# Patient Record
Sex: Female | Born: 1975 | Hispanic: No | Marital: Married | Smoking: Never smoker
Health system: Southern US, Community
[De-identification: ages and names within clinical notes are randomized; demographics above are authoritative.]

## PROBLEM LIST (undated history)

## (undated) ENCOUNTER — Emergency Department (HOSPITAL_COMMUNITY): Payer: 59

## (undated) DIAGNOSIS — J309 Allergic rhinitis, unspecified: Secondary | ICD-10-CM

## (undated) DIAGNOSIS — H519 Unspecified disorder of binocular movement: Secondary | ICD-10-CM

## (undated) DIAGNOSIS — E559 Vitamin D deficiency, unspecified: Secondary | ICD-10-CM

## (undated) DIAGNOSIS — E781 Pure hyperglyceridemia: Secondary | ICD-10-CM

## (undated) DIAGNOSIS — D509 Iron deficiency anemia, unspecified: Secondary | ICD-10-CM

## (undated) DIAGNOSIS — Z8742 Personal history of other diseases of the female genital tract: Secondary | ICD-10-CM

## (undated) DIAGNOSIS — R7302 Impaired glucose tolerance (oral): Secondary | ICD-10-CM

## (undated) DIAGNOSIS — L259 Unspecified contact dermatitis, unspecified cause: Secondary | ICD-10-CM

## (undated) DIAGNOSIS — E7800 Pure hypercholesterolemia, unspecified: Secondary | ICD-10-CM

## (undated) DIAGNOSIS — I10 Essential (primary) hypertension: Secondary | ICD-10-CM

## (undated) HISTORY — DX: Unspecified contact dermatitis, unspecified cause: L25.9

## (undated) HISTORY — DX: Essential (primary) hypertension: I10

## (undated) HISTORY — DX: Vitamin D deficiency, unspecified: E55.9

## (undated) HISTORY — DX: Impaired glucose tolerance (oral): R73.02

## (undated) HISTORY — DX: Unspecified disorder of binocular movement: H51.9

## (undated) HISTORY — DX: Personal history of other diseases of the female genital tract: Z87.42

## (undated) HISTORY — PX: OTHER SURGICAL HISTORY: SHX169

## (undated) HISTORY — DX: Allergic rhinitis, unspecified: J30.9

## (undated) HISTORY — DX: Iron deficiency anemia, unspecified: D50.9

## (undated) HISTORY — PX: TONSILLECTOMY: SUR1361

## (undated) HISTORY — DX: Pure hyperglyceridemia: E78.1

## (undated) HISTORY — DX: Pure hypercholesterolemia, unspecified: E78.00

---

## 2003-01-15 ENCOUNTER — Other Ambulatory Visit: Admission: RE | Admit: 2003-01-15 | Discharge: 2003-01-15 | Payer: Self-pay | Admitting: *Deleted

## 2004-11-05 ENCOUNTER — Inpatient Hospital Stay (HOSPITAL_COMMUNITY): Admission: AD | Admit: 2004-11-05 | Discharge: 2004-11-05 | Payer: Self-pay | Admitting: *Deleted

## 2004-11-07 ENCOUNTER — Inpatient Hospital Stay (HOSPITAL_COMMUNITY): Admission: AD | Admit: 2004-11-07 | Discharge: 2004-11-10 | Payer: Self-pay | Admitting: Obstetrics & Gynecology

## 2004-12-26 ENCOUNTER — Ambulatory Visit: Payer: Self-pay | Admitting: Endocrinology

## 2004-12-30 ENCOUNTER — Ambulatory Visit: Payer: Self-pay | Admitting: Endocrinology

## 2005-01-03 ENCOUNTER — Ambulatory Visit: Payer: Self-pay | Admitting: Endocrinology

## 2005-09-29 ENCOUNTER — Ambulatory Visit: Payer: Self-pay | Admitting: Endocrinology

## 2006-08-08 ENCOUNTER — Ambulatory Visit: Payer: Self-pay | Admitting: Endocrinology

## 2007-01-01 ENCOUNTER — Ambulatory Visit: Payer: Self-pay | Admitting: Endocrinology

## 2007-01-01 LAB — CONVERTED CEMR LAB
BUN: 12 mg/dL (ref 6–23)
CO2: 34 meq/L — ABNORMAL HIGH (ref 19–32)
Glucose, Bld: 113 mg/dL — ABNORMAL HIGH (ref 70–99)
Potassium: 3.5 meq/L (ref 3.5–5.1)
Sed Rate: 10 mm/hr (ref 0–25)
Sodium: 141 meq/L (ref 135–145)

## 2007-02-11 ENCOUNTER — Encounter: Payer: Self-pay | Admitting: Endocrinology

## 2007-02-11 DIAGNOSIS — I10 Essential (primary) hypertension: Secondary | ICD-10-CM

## 2007-02-11 HISTORY — DX: Essential (primary) hypertension: I10

## 2007-04-12 ENCOUNTER — Ambulatory Visit (HOSPITAL_COMMUNITY): Admission: RE | Admit: 2007-04-12 | Discharge: 2007-04-12 | Payer: Self-pay | Admitting: Obstetrics and Gynecology

## 2007-04-12 ENCOUNTER — Encounter (INDEPENDENT_AMBULATORY_CARE_PROVIDER_SITE_OTHER): Payer: Self-pay | Admitting: Obstetrics and Gynecology

## 2008-04-10 ENCOUNTER — Ambulatory Visit: Payer: Self-pay | Admitting: Endocrinology

## 2008-07-26 ENCOUNTER — Ambulatory Visit (HOSPITAL_COMMUNITY): Admission: RE | Admit: 2008-07-26 | Discharge: 2008-07-26 | Payer: Self-pay | Admitting: Obstetrics and Gynecology

## 2008-08-13 ENCOUNTER — Telehealth (INDEPENDENT_AMBULATORY_CARE_PROVIDER_SITE_OTHER): Payer: Self-pay | Admitting: *Deleted

## 2008-08-18 ENCOUNTER — Ambulatory Visit: Payer: Self-pay | Admitting: Endocrinology

## 2008-08-19 LAB — CONVERTED CEMR LAB
Albumin: 3.7 g/dL (ref 3.5–5.2)
Alkaline Phosphatase: 83 units/L (ref 39–117)
BUN: 8 mg/dL (ref 6–23)
Basophils Absolute: 0 10*3/uL (ref 0.0–0.1)
Basophils Relative: 0.4 % (ref 0.0–3.0)
Bilirubin Urine: NEGATIVE
Bilirubin, Direct: 0.1 mg/dL (ref 0.0–0.3)
Calcium: 8.8 mg/dL (ref 8.4–10.5)
Eosinophils Absolute: 0.3 10*3/uL (ref 0.0–0.7)
GFR calc non Af Amer: 122 mL/min
Glucose, Bld: 102 mg/dL — ABNORMAL HIGH (ref 70–99)
HDL: 35.9 mg/dL — ABNORMAL LOW (ref >39.0)
Monocytes Absolute: 0.5 10*3/uL (ref 0.1–1.0)
Neutro Abs: 6.5 10*3/uL (ref 1.4–7.7)
Neutrophils Relative %: 70 % (ref 43.0–77.0)
Nitrite: NEGATIVE
RBC: 4.56 M/uL (ref 3.87–5.11)
RDW: 11.9 % (ref 11.5–14.6)
Sodium: 139 meq/L (ref 135–145)
Specific Gravity, Urine: 1.01 (ref 1.000–1.035)
TSH: 1.34 microintl units/mL (ref 0.35–5.50)
Total Bilirubin: 0.9 mg/dL (ref 0.3–1.2)
Total Protein, Urine: NEGATIVE mg/dL
Urobilinogen, UA: 0.2 (ref 0.0–1.0)
pH: 7.5 (ref 5.0–8.0)

## 2008-08-20 ENCOUNTER — Ambulatory Visit: Payer: Self-pay | Admitting: Endocrinology

## 2008-08-20 ENCOUNTER — Telehealth (INDEPENDENT_AMBULATORY_CARE_PROVIDER_SITE_OTHER): Payer: Self-pay | Admitting: *Deleted

## 2008-08-20 DIAGNOSIS — J309 Allergic rhinitis, unspecified: Secondary | ICD-10-CM | POA: Insufficient documentation

## 2008-08-20 DIAGNOSIS — E7800 Pure hypercholesterolemia, unspecified: Secondary | ICD-10-CM

## 2008-08-20 DIAGNOSIS — L259 Unspecified contact dermatitis, unspecified cause: Secondary | ICD-10-CM

## 2008-08-20 DIAGNOSIS — R5383 Other fatigue: Secondary | ICD-10-CM

## 2008-08-20 DIAGNOSIS — R5381 Other malaise: Secondary | ICD-10-CM

## 2008-08-20 HISTORY — DX: Unspecified contact dermatitis, unspecified cause: L25.9

## 2008-08-20 HISTORY — DX: Allergic rhinitis, unspecified: J30.9

## 2008-08-20 HISTORY — DX: Pure hypercholesterolemia, unspecified: E78.00

## 2008-08-20 LAB — CONVERTED CEMR LAB
Folate: >20 ng/mL
Vitamin B-12: 544 pg/mL (ref 211–911)

## 2008-08-21 ENCOUNTER — Telehealth (INDEPENDENT_AMBULATORY_CARE_PROVIDER_SITE_OTHER): Payer: Self-pay | Admitting: *Deleted

## 2008-08-21 ENCOUNTER — Ambulatory Visit: Payer: Self-pay | Admitting: Endocrinology

## 2008-08-22 ENCOUNTER — Encounter: Payer: Self-pay | Admitting: Endocrinology

## 2008-09-02 ENCOUNTER — Telehealth (INDEPENDENT_AMBULATORY_CARE_PROVIDER_SITE_OTHER): Payer: Self-pay | Admitting: *Deleted

## 2008-10-02 ENCOUNTER — Telehealth: Payer: Self-pay | Admitting: Endocrinology

## 2009-02-18 ENCOUNTER — Ambulatory Visit (HOSPITAL_COMMUNITY): Admission: RE | Admit: 2009-02-18 | Discharge: 2009-02-18 | Payer: Self-pay | Admitting: Obstetrics & Gynecology

## 2009-02-26 ENCOUNTER — Ambulatory Visit (HOSPITAL_COMMUNITY): Admission: RE | Admit: 2009-02-26 | Discharge: 2009-02-26 | Payer: Self-pay | Admitting: Obstetrics and Gynecology

## 2009-03-05 ENCOUNTER — Ambulatory Visit (HOSPITAL_COMMUNITY): Admission: RE | Admit: 2009-03-05 | Discharge: 2009-03-05 | Payer: Self-pay | Admitting: Obstetrics and Gynecology

## 2009-03-10 ENCOUNTER — Inpatient Hospital Stay (HOSPITAL_COMMUNITY): Admission: AD | Admit: 2009-03-10 | Discharge: 2009-03-19 | Payer: Self-pay | Admitting: Obstetrics & Gynecology

## 2009-03-15 ENCOUNTER — Encounter: Payer: Self-pay | Admitting: Obstetrics & Gynecology

## 2009-03-18 ENCOUNTER — Other Ambulatory Visit: Payer: Self-pay | Admitting: Obstetrics and Gynecology

## 2009-03-19 ENCOUNTER — Encounter: Payer: Self-pay | Admitting: Obstetrics and Gynecology

## 2009-03-21 ENCOUNTER — Observation Stay (HOSPITAL_COMMUNITY): Admission: AD | Admit: 2009-03-21 | Discharge: 2009-03-22 | Payer: Self-pay | Admitting: Obstetrics and Gynecology

## 2009-03-29 ENCOUNTER — Inpatient Hospital Stay (HOSPITAL_COMMUNITY): Admission: AD | Admit: 2009-03-29 | Discharge: 2009-04-02 | Payer: Self-pay | Admitting: Obstetrics and Gynecology

## 2009-03-30 ENCOUNTER — Encounter (INDEPENDENT_AMBULATORY_CARE_PROVIDER_SITE_OTHER): Payer: Self-pay | Admitting: Obstetrics and Gynecology

## 2009-04-11 ENCOUNTER — Inpatient Hospital Stay (HOSPITAL_COMMUNITY): Admission: AD | Admit: 2009-04-11 | Discharge: 2009-04-11 | Payer: Self-pay | Admitting: Obstetrics and Gynecology

## 2009-09-27 ENCOUNTER — Telehealth (INDEPENDENT_AMBULATORY_CARE_PROVIDER_SITE_OTHER): Payer: Self-pay | Admitting: *Deleted

## 2009-09-28 ENCOUNTER — Telehealth: Payer: Self-pay | Admitting: Internal Medicine

## 2009-10-11 ENCOUNTER — Telehealth: Payer: Self-pay | Admitting: Endocrinology

## 2009-11-01 ENCOUNTER — Ambulatory Visit: Payer: Self-pay | Admitting: Internal Medicine

## 2009-11-01 DIAGNOSIS — L509 Urticaria, unspecified: Secondary | ICD-10-CM | POA: Insufficient documentation

## 2009-11-01 DIAGNOSIS — D509 Iron deficiency anemia, unspecified: Secondary | ICD-10-CM

## 2009-11-01 DIAGNOSIS — Z8742 Personal history of other diseases of the female genital tract: Secondary | ICD-10-CM | POA: Insufficient documentation

## 2009-11-01 DIAGNOSIS — E559 Vitamin D deficiency, unspecified: Secondary | ICD-10-CM

## 2009-11-01 HISTORY — DX: Personal history of other diseases of the female genital tract: Z87.42

## 2009-11-01 HISTORY — DX: Iron deficiency anemia, unspecified: D50.9

## 2009-11-01 HISTORY — DX: Vitamin D deficiency, unspecified: E55.9

## 2009-11-01 LAB — CONVERTED CEMR LAB
BUN: 10 mg/dL (ref 6–23)
CO2: 27 meq/L (ref 19–32)
Eosinophils Absolute: 0.3 10*3/uL (ref 0.0–0.7)
Eosinophils Relative: 2.4 % (ref 0.0–5.0)
Glucose, Bld: 80 mg/dL (ref 70–99)
Hemoglobin: 13.4 g/dL (ref 12.0–15.0)
Ketones, ur: NEGATIVE mg/dL
Lymphocytes Relative: 27.2 % (ref 12.0–46.0)
Lymphs Abs: 2.9 10*3/uL (ref 0.7–4.0)
MCHC: 33.8 g/dL (ref 30.0–36.0)
Monocytes Absolute: 0.5 10*3/uL (ref 0.1–1.0)
Platelets: 209 10*3/uL (ref 150.0–400.0)
Potassium: 4 meq/L (ref 3.5–5.1)
RBC: 4.53 M/uL (ref 3.87–5.11)
Sodium: 139 meq/L (ref 135–145)
Total CHOL/HDL Ratio: 4
Triglycerides: 146 mg/dL (ref 0.0–149.0)
WBC: 10.6 10*3/uL — ABNORMAL HIGH (ref 4.5–10.5)
pH: 6 (ref 5.0–8.0)

## 2009-11-02 LAB — CONVERTED CEMR LAB: Vit D, 25-Hydroxy: 31 ng/mL (ref 30–89)

## 2009-12-03 ENCOUNTER — Ambulatory Visit: Payer: Self-pay | Admitting: Internal Medicine

## 2010-01-20 ENCOUNTER — Telehealth: Payer: Self-pay | Admitting: Internal Medicine

## 2010-02-28 ENCOUNTER — Telehealth: Payer: Self-pay | Admitting: Internal Medicine

## 2010-03-02 ENCOUNTER — Telehealth: Payer: Self-pay | Admitting: Internal Medicine

## 2010-03-15 ENCOUNTER — Encounter: Payer: Self-pay | Admitting: Internal Medicine

## 2010-04-05 ENCOUNTER — Telehealth: Payer: Self-pay | Admitting: Internal Medicine

## 2010-06-30 ENCOUNTER — Other Ambulatory Visit: Payer: Self-pay | Admitting: Internal Medicine

## 2010-06-30 ENCOUNTER — Encounter: Payer: Self-pay | Admitting: Internal Medicine

## 2010-06-30 ENCOUNTER — Ambulatory Visit
Admission: RE | Admit: 2010-06-30 | Discharge: 2010-06-30 | Payer: Self-pay | Source: Home / Self Care | Attending: Internal Medicine | Admitting: Internal Medicine

## 2010-06-30 DIAGNOSIS — H519 Unspecified disorder of binocular movement: Secondary | ICD-10-CM

## 2010-06-30 HISTORY — DX: Unspecified disorder of binocular movement: H51.9

## 2010-06-30 LAB — TSH: TSH: 1 u[IU]/mL (ref 0.35–5.50)

## 2010-06-30 LAB — CBC WITH DIFFERENTIAL/PLATELET
Basophils Absolute: 0 10*3/uL (ref 0.0–0.1)
Basophils Relative: 0.3 % (ref 0.0–3.0)
Eosinophils Absolute: 0.2 10*3/uL (ref 0.0–0.7)
Eosinophils Relative: 1.7 % (ref 0.0–5.0)
HCT: 40.2 % (ref 36.0–46.0)
Hemoglobin: 13.7 g/dL (ref 12.0–15.0)
Lymphocytes Relative: 25.1 % (ref 12.0–46.0)
Lymphs Abs: 2.6 10*3/uL (ref 0.7–4.0)
MCHC: 34 g/dL (ref 30.0–36.0)
MCV: 88.9 fl (ref 78.0–100.0)
Monocytes Absolute: 0.6 10*3/uL (ref 0.1–1.0)
Monocytes Relative: 5.3 % (ref 3.0–12.0)
Neutro Abs: 7 10*3/uL (ref 1.4–7.7)
Neutrophils Relative %: 67.6 % (ref 43.0–77.0)
Platelets: 222 10*3/uL (ref 150.0–400.0)
RBC: 4.52 Mil/uL (ref 3.87–5.11)
RDW: 13.1 % (ref 11.5–14.6)
WBC: 10.4 10*3/uL (ref 4.5–10.5)

## 2010-06-30 LAB — URINALYSIS
Bilirubin Urine: NEGATIVE
Hemoglobin, Urine: NEGATIVE
Ketones, ur: NEGATIVE
Leukocytes, UA: NEGATIVE
Nitrite: NEGATIVE
Specific Gravity, Urine: 1.01 (ref 1.000–1.030)
Total Protein, Urine: NEGATIVE
Urine Glucose: NEGATIVE
Urobilinogen, UA: 0.2 (ref 0.0–1.0)
pH: 7 (ref 5.0–8.0)

## 2010-06-30 LAB — LIPID PANEL
Cholesterol: 180 mg/dL (ref 0–200)
HDL: 42.8 mg/dL (ref >39.00)
LDL Cholesterol: 101 mg/dL — ABNORMAL HIGH (ref 0–99)
Total CHOL/HDL Ratio: 4
Triglycerides: 180 mg/dL — ABNORMAL HIGH (ref 0.0–149.0)
VLDL: 36 mg/dL (ref 0.0–40.0)

## 2010-06-30 LAB — HEPATIC FUNCTION PANEL
ALT: 17 U/L (ref 0–35)
AST: 17 U/L (ref 0–37)
Albumin: 4 g/dL (ref 3.5–5.2)
Alkaline Phosphatase: 76 U/L (ref 39–117)
Bilirubin, Direct: 0.1 mg/dL (ref 0.0–0.3)
Total Bilirubin: 0.5 mg/dL (ref 0.3–1.2)
Total Protein: 6.9 g/dL (ref 6.0–8.3)

## 2010-06-30 LAB — BASIC METABOLIC PANEL
BUN: 11 mg/dL (ref 6–23)
CO2: 28 mEq/L (ref 19–32)
Calcium: 8.7 mg/dL (ref 8.4–10.5)
Chloride: 101 mEq/L (ref 96–112)
Creatinine, Ser: 0.6 mg/dL (ref 0.4–1.2)
GFR: 127.55 mL/min (ref >60.00)
Glucose, Bld: 82 mg/dL (ref 70–99)
Potassium: 3.7 mEq/L (ref 3.5–5.1)
Sodium: 137 mEq/L (ref 135–145)

## 2010-07-09 ENCOUNTER — Encounter: Payer: Self-pay | Admitting: Obstetrics and Gynecology

## 2010-07-10 ENCOUNTER — Encounter: Payer: Self-pay | Admitting: Obstetrics and Gynecology

## 2010-07-11 ENCOUNTER — Encounter: Payer: Self-pay | Admitting: Obstetrics and Gynecology

## 2010-07-19 NOTE — Progress Notes (Signed)
  Phone Note Refill Request Message from:  Patient on March 02, 2010 10:32 AM  Refills Requested: Medication #1:  VERAPAMIL HCL CR 240 MG CR-TABS 1po once daily   Dosage confirmed as above?Dosage Confirmed   Notes: [facility] [address] Initial call taken by: Scharlene Gloss CMA Duncan Dull),  March 02, 2010 10:33 AM Caller: Patient    Prescriptions: VERAPAMIL HCL CR 240 MG CR-TABS (VERAPAMIL HCL) 1po once daily  #90 x 3   Entered by:   Scharlene Gloss CMA (AAMA)   Authorized by:   [name] MD   Signed by:   Scharlene Gloss CMA (AAMA) on 03/02/2010   Method used:   Faxed to ...       [facility] (retail)       [address]       [place], [place]  [address]       Ph: [phone] or [phone]       Fax: [phone]   RxID:   [id number]

## 2010-07-19 NOTE — Miscellaneous (Signed)
Summary: Immunization Entry   Immunization History:  Influenza Immunization History:    Influenza:  historical (03/12/2010)  [facility] [address], [place] [address] Influenza Site, Left Deltoid IM Exp. Date, 11/2010 Lot # [id number] [employer]

## 2010-07-19 NOTE — Progress Notes (Signed)
  Phone Note Refill Request Message from:  Fax from Pharmacy on October 11, 2009 11:55 AM  Refills Requested: Medication #1:  ALLEGRA 180 MG TABS qd   Dosage confirmed as above?Dosage Confirmed Initial call taken by: Josph Macho RMA,  October 11, 2009 11:55 AM    Prescriptions: ALLEGRA 180 MG TABS (FEXOFENADINE HCL) qd  #30 Tablet x 1   Entered by:   Josph Macho RMA   Authorized by:   Minus Breeding MD   Signed by:   Josph Macho RMA on 10/11/2009   Method used:   Electronically to        [facility] (retail)       [address]       [place], [place]  [address]       Ph: [phone]       Fax: [phone]   RxID:   [id number]

## 2010-07-19 NOTE — Progress Notes (Signed)
Summary: ok to switch from [name] to [name]  ---- Converted from flag ---- ---- 09/17/2009 12:49 PM, [name] MD wrote: ok with me, only if ok with ellison  ---- 09/17/2009 11:52 AM, [name] wrote:   ---- 09/17/2009 11:52 AM, [name] wrote: Doctors'  Pt wants to switch pcp's from [name] to [facility].Pls let me know. Ashley Wyatt,Ashley Wyatt -  Jun 22, 1975    [name] [phone] ------------------------------

## 2010-07-19 NOTE — Progress Notes (Signed)
Summary: medication increase  Phone Note From Pharmacy   Caller: [facility] [address] Summary of Call: Pharmacy sent fax, patient said MD spoke to her about increasing the strength of her Verapamil to 240mg . Patient is requesting to have new prescription sent in for this increase. Initial call taken by: Robin Ewing CMA Duncan Dull),  February 28, 2010 9:00 AM  Follow-up for Phone Call        we mentioned this might be a good idea if her BP still elevated -   has she been checking her BP and what would have they been like? Follow-up by: [name] MD,  February 28, 2010 9:02 AM  Additional Follow-up for Phone Call Additional follow up Details #1::        called pt left. msg to call back  called pt left msg. to call back Additional Follow-up by: Robin Ewing CMA Duncan Dull),  February 28, 2010 9:39 AM    Additional Follow-up for Phone Call Additional follow up Details #2::    called pt. and she has been checking BP at work. BP has been on an average of 145/90, also has been having HA's almost daily. Patient also requested a prescription strength Vitamin D as she can't remember to take the lower dose once daily. Follow-up by: Zella Ball Ewing CMA Duncan Dull),  February 28, 2010 12:05 PM  Additional Follow-up for Phone Call Additional follow up Details #3:: Details for Additional Follow-up Action Taken: ok to incr to 240 mg - done per emr  I would not do the rx strength 50K vit d - has not been shown to help reduce risk of fracture Additional Follow-up by: [name] MD,  February 28, 2010 1:01 PM  New/Updated Medications: VERAPAMIL HCL CR 240 MG CR-TABS (VERAPAMIL HCL) 1po once daily Prescriptions: VERAPAMIL HCL CR 240 MG CR-TABS (VERAPAMIL HCL) 1po once daily  #90 x 3   Entered and Authorized by:   [name] MD   Signed by:   [name] MD on 02/28/2010   Method used:   Electronically to        [facility]  [phone][facility] (retail)       [address] [place], [place]  [address]       Ph: [phone] or [phone]       Fax: [phone]   RxID:   [id number]   Appended Document: medication increase called pt informed prescription sent in and informed of above information.

## 2010-07-19 NOTE — Progress Notes (Signed)
Summary: elevated BP  Phone Note Call from Patient Call back at [facility] Phone [phone]   Caller: Patient Summary of Call: Pt called stating that her BP is still elevated on 240mg  Verapamil. Pt checks BP at lest once daily and it averages 140-150/80-95. Pt is requesting an alternate medicine to help control her BP. Initial call taken by: Margaret Pyle, CMA,  April 05, 2010 8:05 AM  Follow-up for Phone Call        ok to add the hctz 12.5 once daily;   due for OV 4 wks Follow-up by: [name] MD,  April 05, 2010 1:07 PM  Additional Follow-up for Phone Call Additional follow up Details #1::        Pharmacy changed per pt req. Pt advised to make ROV and will call back to sch. Pt is hesitant to try HCTZ but will discuss with MD at OV. Additional Follow-up by: Margaret Pyle, CMA,  April 05, 2010 1:23 PM    New/Updated Medications: HYDROCHLOROTHIAZIDE 12.5 MG CAPS (HYDROCHLOROTHIAZIDE) 1 by mouth once daily Prescriptions: HYDROCHLOROTHIAZIDE 12.5 MG CAPS (HYDROCHLOROTHIAZIDE) 1 by mouth once daily  #90 x 3   Entered by:   Margaret Pyle, CMA   Authorized by:   [name] MD   Signed by:   Margaret Pyle, CMA on 04/05/2010   Method used:   Electronically to        [facility] [address] [address] (retail)       [address]       [place], [place]  [address]       Ph: [phone] or [phone]       Fax: [phone]   RxID:   [id number] HYDROCHLOROTHIAZIDE 12.5 MG CAPS (HYDROCHLOROTHIAZIDE) 1 by mouth once daily  #90 x 3   Entered and Authorized by:   [name] MD   Signed by:   [name] MD on 04/05/2010   Method used:   Electronically to        [facility]  [phone][facility] (retail)       [address] [place], [place]  [address]       Ph: [phone] or [phone]       Fax: [phone]   RxID:   [id number]

## 2010-07-19 NOTE — Progress Notes (Signed)
Summary: Vit D  Phone Note Call from Patient   Caller: Patient [phone] Summary of Call: pt called requesting Vit D level be check with her CBX labs tomorrow 04.15 Pt is currently on prescription strength Vit D Rx's by GYN Initial call taken by: Margaret Pyle, CMA,  September 28, 2009 2:35 PM  Follow-up for Phone Call        ok to add vit D level to cpx labs - v70.0 Follow-up by: [name] MD,  September 28, 2009 2:37 PM  Additional Follow-up for Phone Call Additional follow up Details #1::        added Vitamin D to CPX labs Additional Follow-up by: Scharlene Gloss,  September 28, 2009 2:44 PM

## 2010-07-19 NOTE — Progress Notes (Signed)
----   Converted from flag ---- ---- 03/02/2010 10:14 AM, [name] wrote: Zella Ball,  This pt called and needs the prescription that was sent yesterday sent to [facility] - the one sent yesterday was sent to wrong pharmacy per pt.Thank you.  [name] ------------------------------ Ok will resend prescription to correct pharmacy.

## 2010-07-19 NOTE — Progress Notes (Signed)
Summary: Hep series  ---- Converted from flag ---- ---- 01/19/2010 4:38 PM, [name] MD wrote: yes, that would be fine, should still work out ok  ---- 01/19/2010 4:26 PM, Margaret Pyle, CMA wrote: Pt called stating that she forgot to return for Hep series after 1st shot in May. Does pt need to re-start or can she come in for the second now? ------------------------------  Phone Note Outgoing Call Call back at [facility] Phone [phone]   Call placed by: Margaret Pyle, CMA,  January 20, 2010 10:30 AM Call placed to: Patient Summary of Call: Pt informed that she can make appt on injection schedule to continue Hep series. Pt stated that she was going out of town but would call to sch when she returns. Initial call taken by: Margaret Pyle, CMA,  January 20, 2010 10:31 AM

## 2010-07-19 NOTE — Assessment & Plan Note (Signed)
Summary: NEW PT CPX/ TRANSFER FROM DR [name]/NWS  #   Vital Signs:  Patient profile:   35 year old female Height:      65 inches Weight:      205.25 pounds BMI:     34.28 O2 Sat:      97 % on Room air Temp:     98.7 degrees F oral BP sitting:   132 / 82  (left arm) Cuff size:   regular  Vitals Entered ByZella Ball Ewing (Nov 01, 2009 1:23 PM)  O2 Flow:  Room air  CC: Adult Physical/RE   CC:  Adult Physical/RE.  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 food allergy  but methyldopa can cause hives;  Pt denies CP, sob, doe, wheezing, orthopnea, pnd, worsening LE edema, palps, dizziness or syncope   Pt denies new neuro symptoms such as headache, facial or extremity weakness   Preventive Screening-Counseling & Management  Alcohol-Tobacco     Smoking Status: never      Drug Use:  no.    Problems Prior to Update: 1)  Urticaria  (ICD-708.9) 2)  Vitamin D Deficiency  (ICD-268.9) 3)  Diabetes Mellitus, Gestational, Hx of  (ICD-V13.29) 4)  Anemia-iron Deficiency  (ICD-280.9) 5)  Allergic Rhinitis  (ICD-477.9) 6)  Fatigue  (ICD-780.79) 7)  Eczema  (ICD-692.9) 8)  Hypercholesterolemia  (ICD-272.0) 9)  Routine General Medical Exam@health  Care Facl  (ICD-V70.0) 10)  Hypertension  (ICD-401.9)  Medications Prior to Update: 1)  Fish Oil 300 Mg  Caps (Omega-3 Fatty Acids) .... Take 2 By Mouth Qd 2)  Methyldopa 250 Mg Tabs (Methyldopa) .Marland Kitchen.. 1 By Mouth Two Times A Day 3)  Prenatal Vitamins 0.8 Mg Tabs (Prenatal Multivit-Min-Fe-Fa) .Marland Kitchen.. 1 By Mouth Once Daily 4)  Lotrisone 1-0.05 % Crea (Clotrimazole-Betamethasone) .... Three Times A Day As Needed Rash 5)  Allegra 180 Mg Tabs (Fexofenadine Hcl) .... Qd 6)  Singulair 10 Mg Tabs (Montelukast Sodium) .... Qd 7)  Relamine  Tabs  (Glucos-Chondroit-Paba-E-Lipoic) .Marland Kitchen.. 1 Qd 8)  Macrodantin 50 Mg Caps (Nitrofurantoin Macrocrystal) .... Tid 9)  Metronidazole 250 Mg Tabs (Metronidazole) .... Tid  Current Medications (verified): 1)  Fish Oil 300 Mg  Caps (Omega-3 Fatty Acids) .... Take 2 By Mouth Qd 2)  Verapamil Hcl Cr 180 Mg Xr24h-Cap (Verapamil Hcl) .Marland Kitchen.. 1po Once Daily 3)  Prenatal Vitamins 0.8 Mg Tabs (Prenatal Multivit-Min-Fe-Fa) .Marland Kitchen.. 1 By Mouth Once Daily 4)  Allegra 180 Mg Tabs (Fexofenadine Hcl) .... Qd 5)  Singulair 10 Mg Tabs (Montelukast Sodium) .... Qd 6)  Vitamin D (Ergocalciferol) 50000 Unit Caps (Ergocalciferol) .Marland Kitchen.. 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         all otherwise negative per pt -  except for recurrent mild hives in the last few weeks without obvious etiology and seems easily controlled with otc antihistamine  Physical Exam  General:  alert and overweight-appearing.   Head:  normocephalic and atraumatic.   Eyes:  vision grossly intact, pupils equal, and pupils round.   Ears:  R ear normal and L ear normal.   Nose:  no external deformity and no nasal discharge.   Mouth:  no gingival abnormalities and pharynx pink and moist.   Neck:  supple and no masses.   Lungs:  normal respiratory effort and normal breath sounds.   Heart:  normal rate and regular rhythm.   Abdomen:  soft, non-tender, and normal bowel sounds.   Msk:  no joint tenderness and no joint swelling.   Extremities:  no edema, no erythema  Neurologic:  cranial nerves II-XII intact and strength normal in all extremities.   Skin:  no current hives Psych:  not anxious appearing and not depressed appearing.     Impression & Recommendations:  Problem # 1:  Preventive Health Care (ICD-V70.0) Overall doing well, age appropriate education and counseling updated and referral for appropriate preventive services done unless declined, immunizations up to date or declined, diet counseling done if overweight, urged to quit smoking if smokes , most recent labs reviewed and current ordered if appropriate, ecg reviewed or declined (interpretation per ECG scanned in the EMR if done); information regarding Medicare Prevention requirements given if appropriate; speciality referrals updated as appropriate   Problem # 2:  HYPERTENSION (ICD-401.9)  Her updated medication list for this problem includes:    Verapamil Hcl Cr 180 Mg Xr24h-cap  (Verapamil hcl) .Marland Kitchen... 1po once daily to stop due to ? allergy rxn and efficacy - to change to verapamil sr 180 once daily;l b.p '; pt to call with BP results in  one wk if elevated  Problem # 3:  URTICARIA (ICD-708.9) decliens prednisone for now, may call later  Complete Medication List: 1)  Fish Oil 300 Mg Caps (Omega-3 fatty acids) .... Take 2 by mouth qd 2)  Verapamil Hcl Cr 180 Mg Xr24h-cap (Verapamil hcl) .Marland Kitchen.. 1po once daily 3)  Prenatal Vitamins 0.8 Mg Tabs (Prenatal multivit-min-fe-fa) .Marland Kitchen.. 1 by mouth once daily 4)  Allegra 180 Mg Tabs (Fexofenadine hcl) .... Qd 5)  Singulair 10 Mg Tabs (Montelukast sodium) .... Qd 6)  Vitamin D (ergocalciferol) 50000 Unit Caps (Ergocalciferol) .Marland Kitchen.. 1po once daily  Other Orders: Tdap => 55yrs IM (84132) Admin 1st Vaccine (44010) TwinRix 1ml ( Hep A&B Adult dose) (27253) Admin of Any Addtl Vaccine (66440)  Patient Instructions: 1)  we will send the addon for the hgba1c , and vit d 2)  please call the number on the blue card for results  3)  you had the tetanus shot today 4)  you had the Hep B #1 shot today 5)  please return  in one month and 6 months for the 2nd and third shots (with nurse visit) 6)  stop the methyldopa 7)  start the verapamil ER 180 mg per day 8)  call in one wk for incresaed verapamil if the AVE BP is > 140 for the systolic 9)  Continue all previous medications as before this visit  10)  Please schedule a follow-up appointment in 6 months, or sooner if needed Prescriptions: VERAPAMIL HCL CR 180 MG XR24H-CAP (VERAPAMIL HCL) 1po once daily  #30 x 11   Entered and Authorized by:   [name] MD   Signed by:   [name] MD on 11/01/2009   Method used:   Print then Give to Patient   RxID:   [id number]    Immunizations Administered:  Tetanus Vaccine:    Vaccine Type: Tdap    Site: right deltoid    Mfr: GlaxoSmithKline    Dose: 0.5 ml    Route: IM    Given by: Robin Ewing    Exp. Date: 09/11/2011    Lot #:  [id number]    VIS given: 05/07/07 version given Nov 01, 2009.  TwinRix # 1:    Vaccine Type: TwinRix    Site: left deltoid    Mfr: GlaxoSmithKline    Dose: 0.5 ml    Route: IM    Given by: Robin Ewing    Exp. Date: 04/22/2011    Lot #: [id number]    VIS given: 03/07/07 version given Nov 01, 2009.

## 2010-07-21 NOTE — Assessment & Plan Note (Signed)
Summary: BP IS ELEV/NWS   Vital Signs:  Patient profile:   35 year old female Height:      65 inches Weight:      196.50 pounds BMI:     32.82 O2 Sat:      97 % on Room air Temp:     98.2 degrees F oral Pulse rate:   76 / minute BP sitting:   140 / 100  (left arm) Cuff size:   large  Vitals Entered By: Zella Ball Ewing CMA Duncan Dull) (June 30, 2010 3:34 PM)  O2 Flow:  Room air  CC: BP Elevated/RE   CC:  BP Elevated/RE.  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  Denies worsening depressive symptoms, suicidal ideation, or panic.   She plans to become more active but works 40 hrs, has 3 children; has had some success this past yr with wt loss - lost some wt - approx 25 lbs in the past yr  post having twins (has total 3 kids) , tries to go to  gym 5 times per wk, follows low salt diet, no ETOH use. Overall good compliance with meds, and good tolerability, but would like to change.  No fever, night sweats, loss of appetite or other constitutional symptoms  . Pt states good ability with ADL's, low fall risk, home safety reviewed and adequate, no significant change in hearing or vision.  Does not want more children, not pregnant now.    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 Take 2 By Mouth Qd 2)  Verapamil Hcl Cr 240 Mg Cr-Tabs (Verapamil Hcl) .Marland Kitchen.. 1po Once Daily 3)  Prenatal Vitamins 0.8 Mg Tabs (Prenatal Multivit-Min-Fe-Fa) .Marland Kitchen.. 1 By Mouth Once Daily 4)  Allegra 180 Mg Tabs (Fexofenadine Hcl) .... Qd 5)  Singulair 10 Mg Tabs (Montelukast Sodium) .... Qd 6)  Vitamin D (Ergocalciferol) 50000 Unit Caps (Ergocalciferol) .Marland Kitchen.. 1po Once Daily 7)  Hydrochlorothiazide 12.5 Mg Caps (Hydrochlorothiazide) .Marland Kitchen.. 1 By Mouth Once Daily  Current Medications (verified): 1)  Fish Oil 300 Mg  Caps (Omega-3 Fatty Acids) .... Take 2 By Mouth Qd 2)  Benicar 40 Mg Tabs (Olmesartan Medoxomil) .Marland Kitchen.. 1po Once Daily 3)  Hydrochlorothiazide 12.5 Mg Caps (Hydrochlorothiazide) .Marland Kitchen.. 1 Po Once Daily  Allergies (verified): No Known Drug Allergies  Past History:  Past Medical History: Last updated: 11/01/2009 HYPERCHOLESTEROLEMIA (ICD-272.0) ROUTINE GENERAL MEDICAL EXAM@HEALTH  CARE FACL (ICD-V70.0) HYPERTENSION (ICD-401.9) Allergic rhinitis Anemia-iron deficiency gestational DM hx vit d deficiency strabismus , mild, bilat  Past Surgical History: Last updated: 11/01/2009 Tonsillectomy  s/p right knee arthroscopic surgury - dr scott/[facility]  Family History: Last updated: 11/01/2009 HTN - parents and grandparents breast cancer - p-aunt, m-cousin,  also an uncle with female breast cancer  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         all otherwise negative per pt -  except has seen optho   has right strabismus and left mild ptosis - reqeusts lab  Physical Exam  General:  alert and overweight-appearing.   Head:  normocephalic and atraumatic.   Eyes:  vision grossly intact, pupils equal, and pupils round.   Ears:  R ear normal and L ear normal.   Nose:  no external deformity and no nasal discharge.   Mouth:  no gingival abnormalities and pharynx pink and moist.   Neck:  supple and no masses.   Lungs:  normal respiratory effort and normal breath sounds.   Heart:  normal rate and regular rhythm.   Abdomen:  soft, non-tender, and normal bowel sounds.   Msk:  no joint tenderness and no joint swelling.   Extremities:  no edema, no erythema  Neurologic:  cranial nerves II-XII intact and strength normal in all extremities.   Skin:  color normal and no rashes.   Psych:  moderately anxious.     Impression & Recommendations:  Problem # 1:  Preventive Health Care (ICD-V70.0) Overall doing well, age appropriate education and counseling updated, referral for preventive services and immunizations addressed, dietary counseling and smoking status adressed , most recent labs reviewed I have personally reviewed and have noted 1.The patient's medical and social history 2.Their use of alcohol, tobacco or illicit drugs 3.Their current medications and supplements 4. Functional ability including ADL's, fall risk, home safety risk, hearing & visual impairment  5.Diet and physical activities 6.Evidence for depression or mood disorders The patients weight, height, BMI  have been recorded in the chart I have made referrals, counseling and provided education to the patient based review of the above    Orders: TLB-BMP (Basic Metabolic Panel-BMET) (80048-METABOL) TLB-CBC Platelet - w/Differential (85025-CBCD) TLB-Hepatic/Liver Function Pnl (80076-HEPATIC) TLB-TSH (Thyroid Stimulating Hormone) (84443-TSH) TLB-Lipid Panel (80061-LIPID) TLB-Udip ONLY (81003-UDIP)  Problem # 2:  HYPERTENSION (ICD-401.9)  The following medications were removed from the medication list:    Verapamil Hcl Cr 240 Mg Cr-tabs (Verapamil hcl) .Marland Kitchen... 1po once daily Her updated medication list for this problem includes:    Benicar 40 Mg Tabs (Olmesartan medoxomil) .Marland Kitchen... 1po once daily    Hydrochlorothiazide 12.5 Mg Caps (Hydrochlorothiazide) .Marland Kitchen... 1 po once daily  BP today: 140/100 Prior BP: 132/82 (11/01/2009)  Labs Reviewed: K+: 4.0 (11/01/2009) Creat: : 0.6 (11/01/2009)   Chol: 151 (11/01/2009)   HDL: 38.90 (11/01/2009)   LDL: 83 (11/01/2009)   TG: 146.0 (11/01/2009) uncontrolled- meds adjusted to above;  for safety to start with HALF benicar adn monitor BP for one wk, but suspect she will need the 40 mg, or even add amlodipine  Problem # 3:  STRABISMUS (ICD-378.9) with ptosis - mild  - for lab per optho request Orders: T- * Misc. Laboratory test [phone])  Complete Medication List: 1)  Fish Oil 300 Mg Caps (Omega-3 fatty acids) .... Take 2 by mouth qd 2)  Benicar 40 Mg Tabs (Olmesartan medoxomil) .Marland Kitchen.. 1po once daily 3)  Hydrochlorothiazide 12.5 Mg Caps (Hydrochlorothiazide) .Marland Kitchen.. 1 po once daily  Patient Instructions: 1)  Please take all new medications as prescribed , except start with HALF of the Benicar 40 mg for one wk to start;  if your Blood Pressure is no better in one wk, you should increase to the Whole 40 mg 2)  Continue all previous medications as before this visit  3)  Please go to the Lab in the basement for your blood and/or urine tests today 4)  Please call the number on the [facility] Card for results of your testing  5)  Please schedule a follow-up appointment in 6 months, or sooner if  needed Prescriptions: HYDROCHLOROTHIAZIDE 12.5 MG CAPS (HYDROCHLOROTHIAZIDE) 1 po once daily  #90 x 3   Entered and Authorized by:   [name] MD   Signed by:   [name] MD on 06/30/2010   Method used:   Print then Give to Patient   RxID:   [id number] BENICAR 40 MG TABS (OLMESARTAN MEDOXOMIL) 1po once daily  #90 x 3   Entered and Authorized by:   [name] MD   Signed by:   [name] MD on 06/30/2010   Method used:   Print then Give to Patient   RxID:   [id number]    Orders Added: 1)  T- * Misc. Laboratory test [99999] 2)  TLB-BMP (Basic Metabolic Panel-BMET) [80048-METABOL] 3)  TLB-CBC Platelet - w/Differential [85025-CBCD] 4)  TLB-Hepatic/Liver Function Pnl [80076-HEPATIC] 5)  TLB-TSH (Thyroid Stimulating Hormone) [84443-TSH] 6)  TLB-Lipid Panel [80061-LIPID] 7)  TLB-Udip ONLY [81003-UDIP] 8)  Est. Patient 18-39 years [84696]

## 2010-09-15 ENCOUNTER — Telehealth: Payer: Self-pay | Admitting: Internal Medicine

## 2010-09-15 NOTE — Telephone Encounter (Signed)
PA requested for Benicar 40mg  tab PA Form requested from [employer]/HMO @[phone] PA form received & forwarded to Dr [name] for completion

## 2010-09-16 NOTE — Telephone Encounter (Signed)
Received completed PA form from MD. PA form faxed to [employer]/waiting for Approval

## 2010-09-22 LAB — URINE CULTURE: Colony Count: >=100000

## 2010-09-22 LAB — CBC
HCT: 33.4 % — ABNORMAL LOW (ref 36.0–46.0)
HCT: 35.9 % — ABNORMAL LOW (ref 36.0–46.0)
Hemoglobin: 11.5 g/dL — ABNORMAL LOW (ref 12.0–15.0)
MCHC: 34.6 g/dL (ref 30.0–36.0)
MCV: 93.8 fL (ref 78.0–100.0)
Platelets: 138 10*3/uL — ABNORMAL LOW (ref 150–400)
RBC: 3.56 MIL/uL — ABNORMAL LOW (ref 3.87–5.11)
RBC: 3.84 MIL/uL — ABNORMAL LOW (ref 3.87–5.11)
WBC: 14.1 10*3/uL — ABNORMAL HIGH (ref 4.0–10.5)

## 2010-09-22 LAB — GLUCOSE, CAPILLARY
Glucose-Capillary: 97 mg/dL (ref 70–99)
Glucose-Capillary: 99 mg/dL (ref 70–99)

## 2010-09-22 LAB — URINALYSIS, ROUTINE W REFLEX MICROSCOPIC
Bilirubin Urine: NEGATIVE
Ketones, ur: NEGATIVE mg/dL
Nitrite: NEGATIVE
Protein, ur: NEGATIVE mg/dL
pH: 7 (ref 5.0–8.0)

## 2010-09-22 LAB — RPR: RPR Ser Ql: NONREACTIVE

## 2010-09-22 LAB — URINE MICROSCOPIC-ADD ON

## 2010-09-22 LAB — FETAL FIBRONECTIN: Fetal Fibronectin: POSITIVE

## 2010-09-22 NOTE — Telephone Encounter (Signed)
Spoke w/Pt who is quite reasonably upset w/the length of process of her PA. She has been trying to get this approval for [5] weeks and the med was paid for w/Benicar card at time of request from pharmacy. 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 Explained to Pt that I have just recvd her PA request as of Tuesday 09/14/2010 and how sorry I am that she has had to endure this type of wait. Reiterated to Pt that I recvd PA request on 09/15/10 and forwarded form to MD for completion; completed PA form recvd and faxed to [facility] 09/16/10 and I am still awaiting the Approval and/or Denail from [employer] at this time. Pt states that she will contact [employer]. Informed Pt that I would be glad to give her any samples of the requested med that we have on-hand to compensate and assist her until we can get decision from insurance. Informed Pt that the samples will be at the front desk ready for p/u for her. Pt was grateful for the assistance.

## 2010-09-23 LAB — URINALYSIS, ROUTINE W REFLEX MICROSCOPIC
Bilirubin Urine: NEGATIVE
Glucose, UA: NEGATIVE mg/dL
Glucose, UA: NEGATIVE mg/dL
Ketones, ur: NEGATIVE mg/dL
Nitrite: NEGATIVE
Nitrite: NEGATIVE
Protein, ur: NEGATIVE mg/dL
Protein, ur: NEGATIVE mg/dL
pH: 6.5 (ref 5.0–8.0)
pH: 6.5 (ref 5.0–8.0)

## 2010-09-23 LAB — CBC
MCHC: 33.8 g/dL (ref 30.0–36.0)
MCHC: 34 g/dL (ref 30.0–36.0)
MCV: 93.2 fL (ref 78.0–100.0)
MCV: 93.6 fL (ref 78.0–100.0)
Platelets: 155 10*3/uL (ref 150–400)
Platelets: 158 10*3/uL (ref 150–400)
RBC: 4.1 MIL/uL (ref 3.87–5.11)
RDW: 13.6 % (ref 11.5–15.5)

## 2010-09-23 LAB — GLUCOSE, CAPILLARY
Glucose-Capillary: 100 mg/dL — ABNORMAL HIGH (ref 70–99)
Glucose-Capillary: 104 mg/dL — ABNORMAL HIGH (ref 70–99)
Glucose-Capillary: 106 mg/dL — ABNORMAL HIGH (ref 70–99)
Glucose-Capillary: 111 mg/dL — ABNORMAL HIGH (ref 70–99)
Glucose-Capillary: 121 mg/dL — ABNORMAL HIGH (ref 70–99)
Glucose-Capillary: 128 mg/dL — ABNORMAL HIGH (ref 70–99)
Glucose-Capillary: 136 mg/dL — ABNORMAL HIGH (ref 70–99)
Glucose-Capillary: 138 mg/dL — ABNORMAL HIGH (ref 70–99)
Glucose-Capillary: 138 mg/dL — ABNORMAL HIGH (ref 70–99)
Glucose-Capillary: 145 mg/dL — ABNORMAL HIGH (ref 70–99)
Glucose-Capillary: 147 mg/dL — ABNORMAL HIGH (ref 70–99)
Glucose-Capillary: 149 mg/dL — ABNORMAL HIGH (ref 70–99)
Glucose-Capillary: 168 mg/dL — ABNORMAL HIGH (ref 70–99)
Glucose-Capillary: 178 mg/dL — ABNORMAL HIGH (ref 70–99)
Glucose-Capillary: 75 mg/dL (ref 70–99)
Glucose-Capillary: 83 mg/dL (ref 70–99)
Glucose-Capillary: 86 mg/dL (ref 70–99)
Glucose-Capillary: 88 mg/dL (ref 70–99)

## 2010-09-23 LAB — URINE CULTURE
Colony Count: >=100000
Special Requests: NEGATIVE

## 2010-09-23 LAB — RPR: RPR Ser Ql: NONREACTIVE

## 2010-09-23 LAB — STREP B DNA PROBE: Strep Group B Ag: POSITIVE

## 2010-09-23 LAB — URINE MICROSCOPIC-ADD ON

## 2010-09-23 LAB — MAGNESIUM
Magnesium: 4.3 mg/dL — ABNORMAL HIGH (ref 1.5–2.5)
Magnesium: 4.8 mg/dL — ABNORMAL HIGH (ref 1.5–2.5)

## 2010-09-27 ENCOUNTER — Telehealth: Payer: Self-pay | Admitting: Internal Medicine

## 2010-09-27 NOTE — Telephone Encounter (Signed)
Pt's insurance denied PA for Benicar stating PT must have experienced failure, contraindication, or intolerance to any [2] Step 1 drugs before being approved for any Step 2 or 3. A list of step therapy alternatives was forwarded w/denial & I have placed the paperwork at your desk. Please advise if you need to pursue this PA/Benicar any further.

## 2010-09-28 ENCOUNTER — Telehealth: Payer: Self-pay | Admitting: Internal Medicine

## 2010-09-28 MED ORDER — LOSARTAN POTASSIUM 100 MG PO TABS: 100.0000 mg | ORAL_TABLET | Freq: Every day | ORAL | Status: DC

## 2010-09-28 NOTE — Telephone Encounter (Signed)
In response to recent telephone note and fax from [place] indicating benicar no longer covered;  Information from [place] reviewed and step I drug includes lisinopril and losartan;   Please call pt  - I will go ahead and change to lostartan 100 mg, but if this , and then lisinopril not as effective or not tolerated, he may be able to go back to benicar

## 2010-09-29 NOTE — Telephone Encounter (Signed)
No answer

## 2010-09-30 NOTE — Telephone Encounter (Signed)
Left message on machine (cell) for pt to return my call, per female that answered work number, pt no longer work at that number.

## 2010-10-04 NOTE — Telephone Encounter (Signed)
Robin to change the losartan back to benicar on the med list  thanks

## 2010-10-04 NOTE — Telephone Encounter (Signed)
Pt states that Benicar works well for her and she does not want to change. Pt says with a coupon she was given by [employer] the medication is low cost ans even after coupon expires (10 months) she will pay out of pocket unless it is very expensive. Losartan Rx destroyed

## 2010-10-04 NOTE — Telephone Encounter (Signed)
Added Benicar 40 to patients medication list

## 2010-10-17 ENCOUNTER — Telehealth: Payer: Self-pay

## 2010-10-17 NOTE — Telephone Encounter (Signed)
[facility] Triage Call Report Triage Record Num: [id number] Operator: Edgar Frisk Patient Name: Ashley Wyatt Call Date & Time: 10/15/2010 8:12:50AM Patient Phone: [phone] PCP: Oliver Barre Patient Gender: Female PCP Fax : Patient DOB: 03/14/1976 Practice Name: [facility] Reason for Call: Pt reports her foot began hurting couple days ago , worse this am with swelling and difficulty walking. No improvement with rest . Care advice per Foot Injury Guideline SS for call back reviewed Protocol(s) Used: Foot Injury Recommended Outcome per Protocol: See Provider within 24 hours Reason for Outcome: Swelling that persists 72 hours following injury even with home care Care Advice: ~ Call provider if have numbness and tingling, or severe pain at rest. Limit Swelling and Reduce Pain: - Rest the painful area and limit weight bearing or gripping, and any strenuous exercise, especially repetitive motion, playing tennis, or any activity that makes the pain worse. - Apply a cloth-covered cold pack to the extremity for no more than 20 minutes, 4 to 8 times a day. Cold helps relieve pain and swelling. - Apply an elastic bandage to the extremity to limit the swelling. Do not wrap extremity too tightly. - Elevate the extremity above the level of the heart. ~ Analgesic/Antipyretic Advice - NSAIDs: Consider aspirin, ibuprofen, naproxen or ketoprofen for pain or fever as directed on label or by pharmacist/provider. PRECAUTIONS: - If over 80 years of age, should not take longer than 1 week without consulting provider. EXCEPTIONS: - Should not be used if taking blood thinners or have bleeding problems. - Do not use if have history of sensitivity/allergy to any of these medications; or history of cardiovascular, ulcer, kidney, liver disease or diabetes unless approved by provider. - Do not exceed recommended dose or frequency. ~ 10/15/2010 8:25:14AM Page 1 of 1 [id number]

## 2010-11-01 NOTE — Op Note (Signed)
NAMELYFE, REIHL NO.:  [id number]   MEDICAL RECORD NO.:  [id number]           PATIENT TYPE:   LOCATION:  SDC                           FACILITY:  [facility]   PHYSICIAN:  Lenoard Aden, M.D.DATE OF BIRTH:  21-Oct-1975   DATE OF PROCEDURE:  04/12/2007  DATE OF DISCHARGE:                               OPERATIVE REPORT   PREOPERATIVE DIAGNOSIS:  Missed AB.   POSTOPERATIVE DIAGNOSIS:  Missed AB.   PROCEDURE:  Suction D&E.   SURGEON:  Lenoard Aden, M.D.   ANESTHESIA:  MAC paracervical.   SPECIMENS:  Products of conception to pathology.   DRAINS:  Foley.   COUNTS:  Correct.   Patient to recovery in good condition.   BRIEF OPERATIVE NOTE:  After being apprised of the risks of anesthesia,  infection, bleeding, injury to abdominal organs, need for repair,  delayed versus immediate, complications, to include bowel and bladder  injury, the patient was brought to the operating room.  She was  administered IV sedation without difficulty, prepped and draped in usual  sterile fashion, catheterized until the bladder was empty.  Exam under  anesthesia revealed an 8-to 10-week mid-position uterus, no adnexal  masses.  Paracervical block placed in the dilute 2% lidocaine solution  in a standard fashion, 20 mL total.  After achieving adequate  anesthesia, the anterior lip of the cervix was grasped using a single-  tooth tenaculum.  The cervix was easily dilated up to a #31 Pratt  dilator, 10-mm suction curette placed.  Suction was initiated, revealing  products of conception, which were sent to pathology.  Blunt curettage  in a four-quadrant method revealed the cavity to be empty.  Repeat  suction confirms.  Good hemostasis was noted.  All instruments were  removed.  The patient tolerated the procedure well and was transferred  to recovery in good condition.      Lenoard Aden, M.D.  Electronically Signed     RJT/MEDQ  D:  04/12/2007  T:  04/14/2007   Job:  [id number]

## 2010-11-04 NOTE — Discharge Summary (Signed)
NAMEANTRICE, PAL              ACCOUNT NO.:  [id number]   MEDICAL RECORD NO.:  [id number]          PATIENT TYPE:  INP   LOCATION:  9108                          [facility]ACILITY:  [facility]   PHYSICIAN:  Gerri Spore B. Earlene Plater, M.D.  DATE OF BIRTH:  [birth year]   DATE OF ADMISSION:  11/07/2004  DATE OF DISCHARGE:  11/10/2004                                 DISCHARGE SUMMARY   ADMITTING DIAGNOSES:  1.  A 39-week intrauterine pregnancy.  2.  Spontaneous delivery.  3.  Chronic hypertension.  4.  Failure to descend.  5.  Suspicion of macrosomia.   DISCHARGE DIAGNOSES:  1.  A 39-week intrauterine pregnancy.  2.  Spontaneous delivery.  3.  Chronic hypertension.  4.  Failure to descend.  5.  Suspicion of macrosomia.   PROCEDURE:  1.  Admission for management of labor.  2.  Primary low transverse cesarean section for failure to descend.   HISTORY OF PRESENT ILLNESS:  Patient presented in spontaneous labor at 39+  weeks and progressed well to complete.  She pushed for approximately two  hours and stated she was exhausted and requesting assistance.  My clinical  impression was of a large fetus and I recommended against a trial of  operative vaginal delivery of concern for potential shoulder dystocia and  subsequent injury to the baby.  Therefore recommended primary cesarean  section.  The patient in agreement.  Her surgery was delayed by an emergency  case of mine.  Ultimately, patient was delivered by primary low transverse  cesarean section viable female.  Apgars were 8 and 9.  Weight 9 pounds 7  ounces.   Postpartum patient rapidly regained her ability to ambulate, void, and  tolerate a regular diet.  She was discharged home on the third postoperative  day, satisfactory condition.   DISCHARGE MEDICATIONS:  Tylox one to two p.o. q.4-6h. p.r.n. pain.   DISCHARGE INSTRUCTIONS:  Standard preprinted instructions given prior to  dismissal.   DISPOSITION:  Satisfactory.   [facility],  Dr. Earlene Plater 1 month.       WBD/MEDQ  D:  12/05/2004  T:  12/05/2004  Job:  [id number]

## 2010-11-04 NOTE — H&P (Signed)
Ashley Wyatt, Ashley Wyatt              ACCOUNT NO.:  [id number]   MEDICAL RECORD NO.:  [id number]          PATIENT TYPE:  INP   LOCATION:  9165                          [facility]ILITY[facility]   PHYSICIAN:  Genia Del, M.D.DATE OF BIRTH:  03-16-1976   DATE OF ADMISSION:  11/07/2004  DATE OF DISCHARGE:                                HISTORY & PHYSICAL   HISTORY AND PHYSICAL:  Mrs. Rettinger is a 35 year old G1 at 39+ weeks  gestation; estimated date of delivery Nov 12, 2004.   REASON FOR ADMISSION:  Spontaneous labor with regular uterine contractions  since 6 a.m.   HISTORY OF PRESENT ILLNESS:  Increasing uterine contractions, no vaginal  bleeding, no fluid leak, fetal movements positive, no PIH symptoms.   PAST MEDICAL HISTORY:  Positive for eczema.   PAST SURGICAL HISTORY:  Negative.   No known drug allergies.   MEDICATIONS:  Prenate vitamins.   SOCIAL HISTORY:  Married, nonsmoker, vegetarian.   HISTORY OF PRESENT PREGNANCY:  First trimester normal. Labs were normal with  B positive blood/group, rubella immune, STD negative. The triple test was  within normal limits. Ultrasound review of anatomy was within normal limits.  One-hour GTT was within normal limits and group B strep was negative. The  patient developed a borderline increased blood pressure in the third  trimester. PIH labs were negative and a 24-hour urine was within normal  limits. Uterine heights corresponded well.   REVIEW OF SYSTEMS:  CONSTITUTIONAL:  Negative. HEENT:  Negative.  CARDIOVASCULAR, RESPIRATORY:  Negative. URO, GI:  Negative. DERMATO, NEURO,  ENDOCRINO:  Negative.   PHYSICAL EXAMINATION:  GENERAL:  No apparent distress.  VITAL SIGNS:  Blood pressure on admission was 142/98, temperature 98.1,  pulse 98 and regular, and respiratory rate 20.  LUNGS:  Clear bilaterally.  HEART:  Regular cardiac rhythm, no murmur.  ABDOMEN:  Gravid, cephalic presentation.  PELVIC:  Vaginal exam on admission was 4-5 cm  dilatation, vertex, membranes  intact.  LOWER LIMBS:  Normal.   PIH labs within normal limits with platelets at 152 and giant platelets were  seen on smear. Fetal heart rate monitoring 140s with good accelerations, no  decelerations, and uterine contractions every 3-4 minutes.   IMPRESSION:  Gravida 1, 39+ weeks gestation with spontaneous labor, fetal  well-being reassuring, mild pregnancy-induced hypertension, no evidence of  preeclampsia.   PLAN:  1.  Admit to labor and delivery.  2.  Expectant management towards probable vaginal delivery.  3.  Monitoring.       ML/MEDQ  D:  11/07/2004  T:  11/07/2004  Job:  [id number]

## 2010-11-04 NOTE — Op Note (Signed)
[name], [name]              ACCOUNT NO.:  [id number]   MEDICAL RECORD NO.:  [id number]          PATIENT TYPE:  INP   LOCATION:  9108                          [facility]   PHYSICIAN:  Gerri Spore B. Earlene Plater, M.D.  DATE OF BIRTH:  Aug 08, 1975   DATE OF PROCEDURE:  11/07/2004  DATE OF DISCHARGE:                                 OPERATIVE REPORT   PREOPERATIVE DIAGNOSIS:  Failure to descend.   POSTOPERATIVE DIAGNOSIS:  Failure to descend.   PROCEDURE:  Primary low transverse cesarean section.   SURGEON:  Chester Holstein. Earlene Plater, M.D.   ANESTHESIA:  Epidural.   FINDINGS:  A viable female, Apgars were 8 and 9.  Weight 9 pounds 7 ounces.   ESTIMATED BLOOD LOSS:  700.   COMPLICATIONS:  None.   INDICATIONS:  The patient presented at 39+ weeks in spontaneous labor,  progressed to complete, pushed for greater than two hours with no descent.  Had a significant amount of caput, and my suspicion was for an LGA or  macrosomic infant.  I therefore recommended against vacuum but did offer it  as an alternative to C-section.  Operative risks were discussed, including  infection, bleeding, damage to surrounding organs.   PROCEDURE:  The patient was taken to the operating room with epidural  anesthesia in place.  She was prepped and draped in standard fashion and  Foley catheter inserted in the bladder.   A Pfannenstiel incision was made with a knife and carried sharply to the  fascia.  The fascia was divided sharply and extended laterally.  Underlying  rectus muscles were dissected off sharply.  The posterior sheath and  peritoneum were elevated and entered sharply, the bladder blade inserted.  The bladder flap created with sharp and blunt technique.  The uterine  incision made in a low transverse fashion with a knife.  Clear fluid noted  at amniotomy.  The incision was extended laterally with bandage scissors.  The infant's head was delivered through the incision with the assistance of  the  vacuum.  The mushroom cup was placed on the flexion point.  One pull was  necessary, no pop-offs.  It was used in the mid-green zone.  The remainder  of the infant was delivered by delivering each arm by flexion at the elbow  and the remainder of the infant delivered without difficulty.  The cord was  clamped and cut and the infant handed off to the waiting pediatricians.  Ancef 1 g was given at cord clamp.   The placenta was removed manually, the uterus left in the abdomen and  cleared of all clots and debris.  The uterine incision was free of  extension.  It was closed in a running locked stitch of 0 chromic.  A second  imbricating layer placed with hemostasis obtained.   The bladder flap, uterine incision and subfascial space were all hemostatic.  The fascia was closed with a running stitch of 0 Vicryl.  The subcutaneous  tissue was irrigated and made hemostatic with the Bovie and reapproximated  with a running stitch of plain 0 suture.  The  skin was closed with staples.   The patient tolerated the procedure well with no complications.  She was  taken to the recovery room awake, alert, and in stable condition.  All  counts correct per the operating room staff.      WBD/MEDQ  D:  11/07/2004  T:  11/08/2004  Job:  [id number]

## 2010-12-03 IMAGING — US US OB TRANSVAGINAL
1 series · 18 of 18 positions shown · non-contrast
Comparison: none

OBSTETRICAL ULTRASOUND:
 This ultrasound was performed in The [HOSPITAL], and the [facility] report will be stored to [REDACTED] PACS.

[Series 1: us ob transvaginal · 18 of 18 slices shown]
[im 1/18]
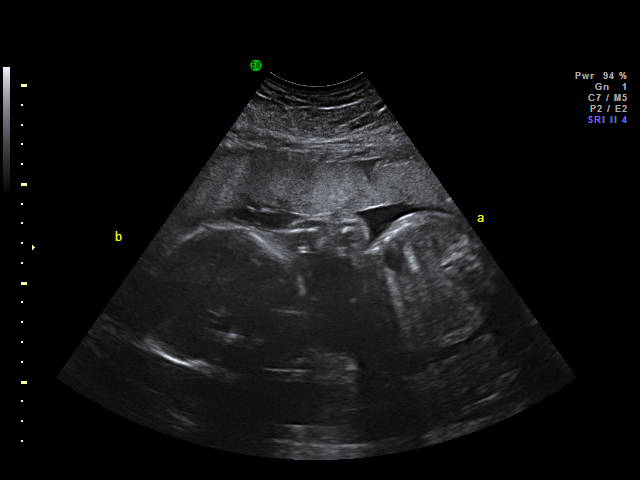
[im 2/18]
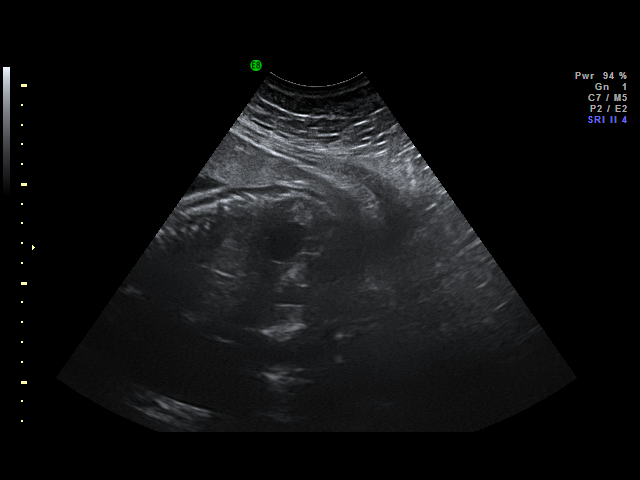
[im 3/18]
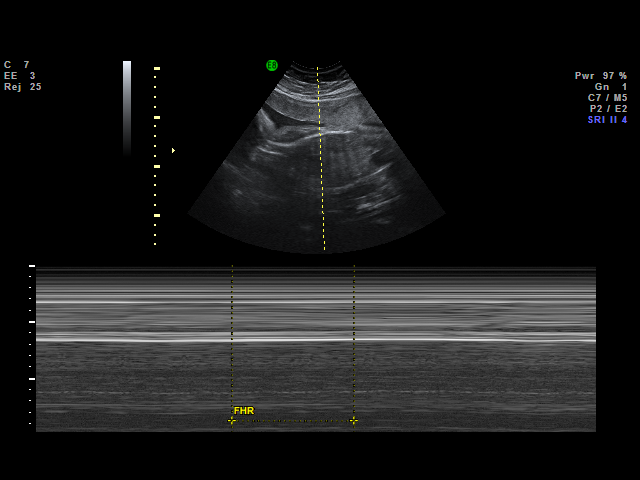
[im 4/18]
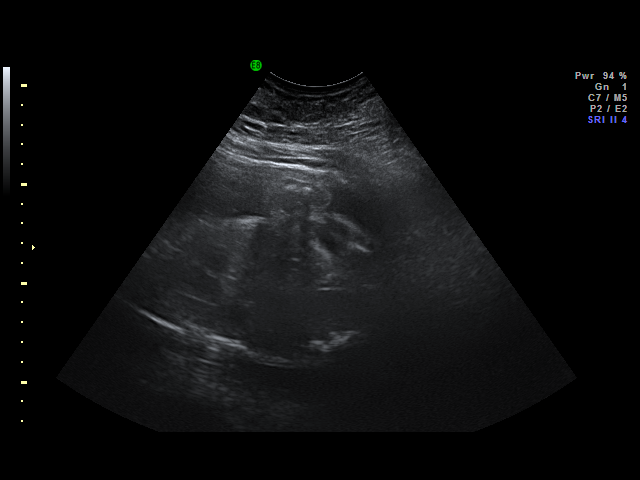
[im 5/18]
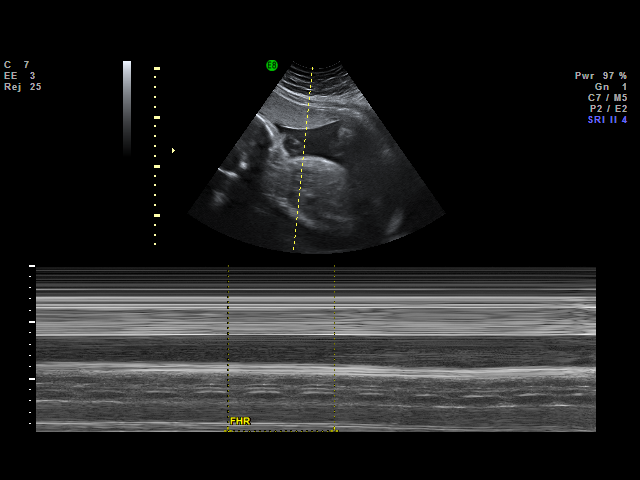
[im 6/18]
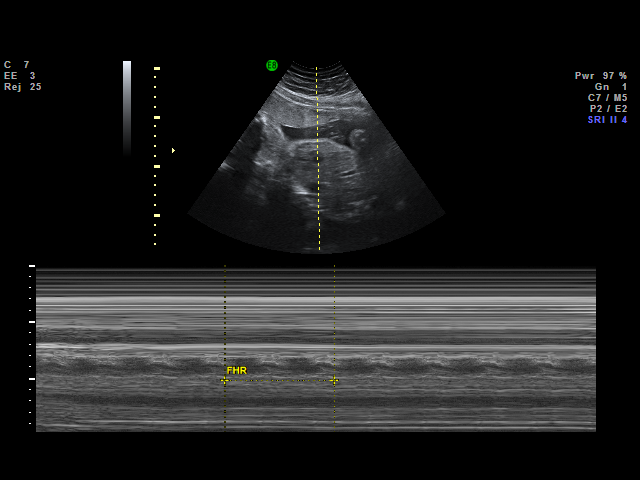
[im 7/18]
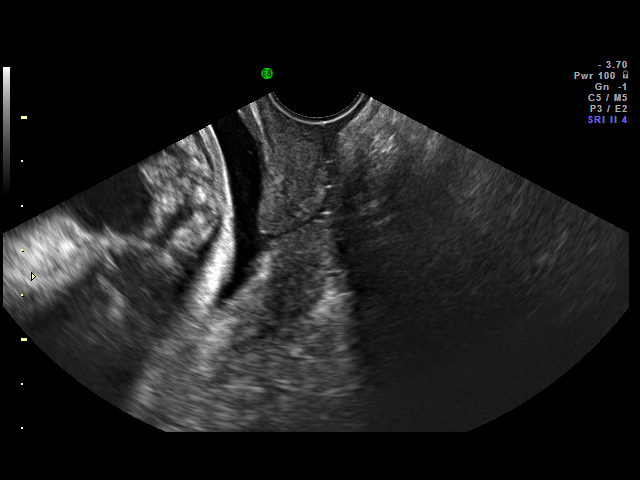
[im 8/18]
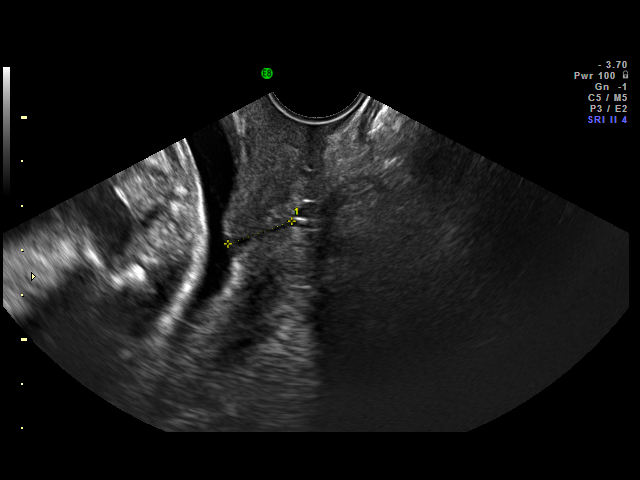
[im 9/18]
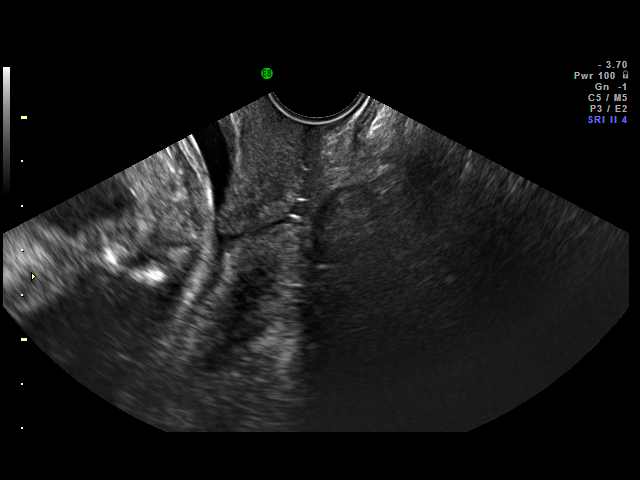
[im 10/18]
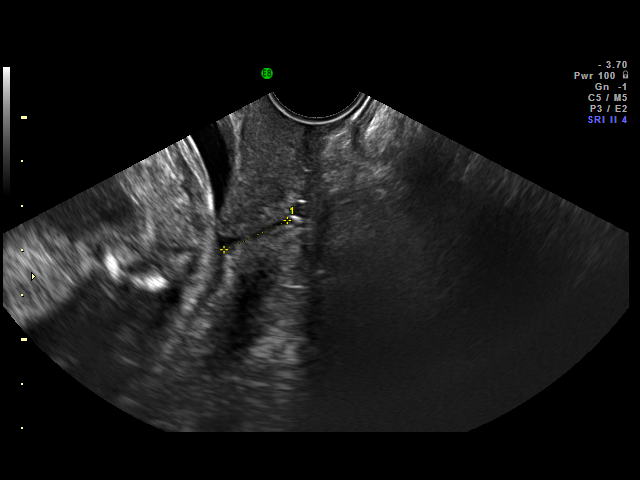
[im 11/18]
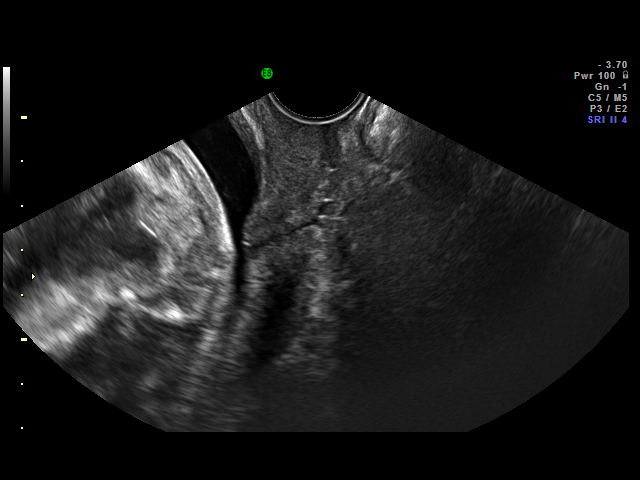
[im 12/18]
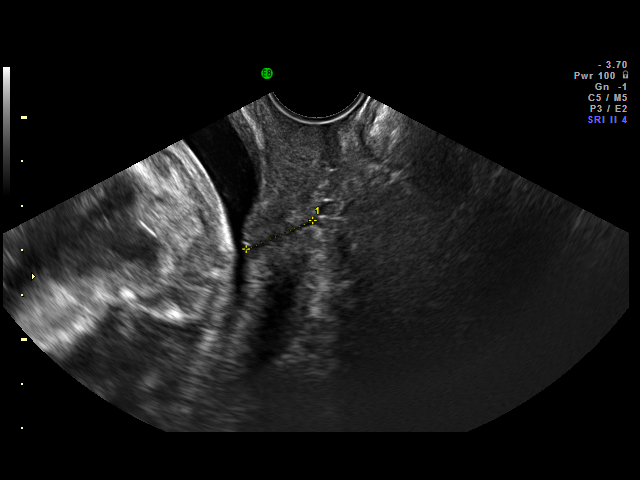
[im 13/18]
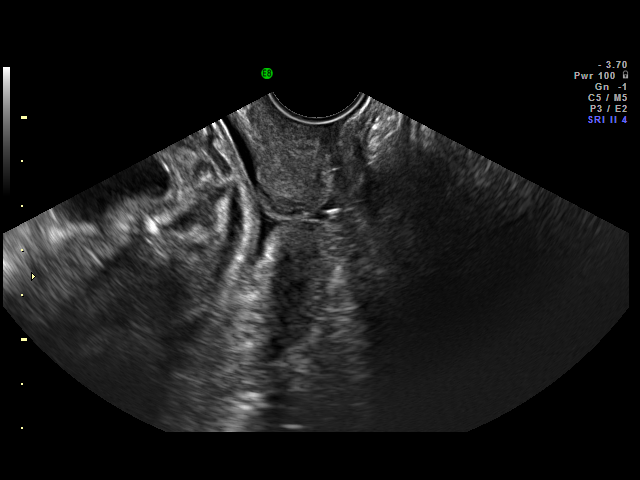
[im 14/18]
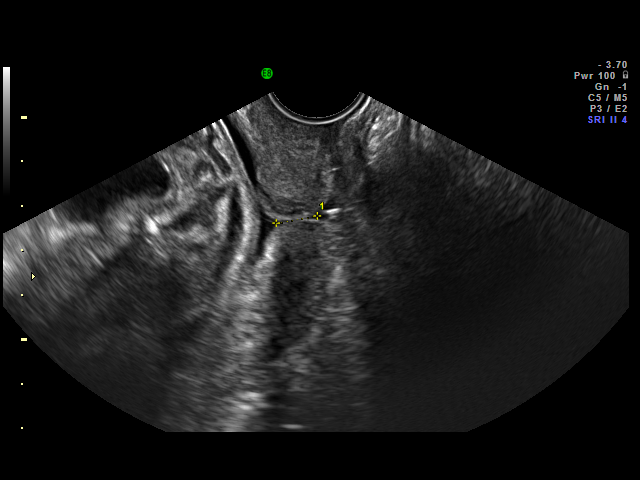
[im 15/18]
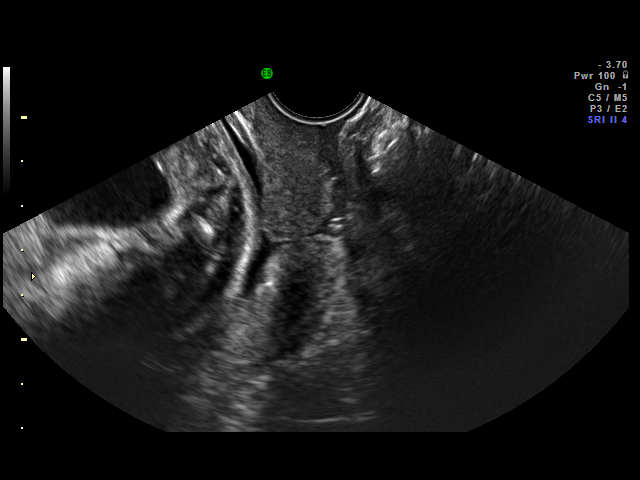
[im 16/18]
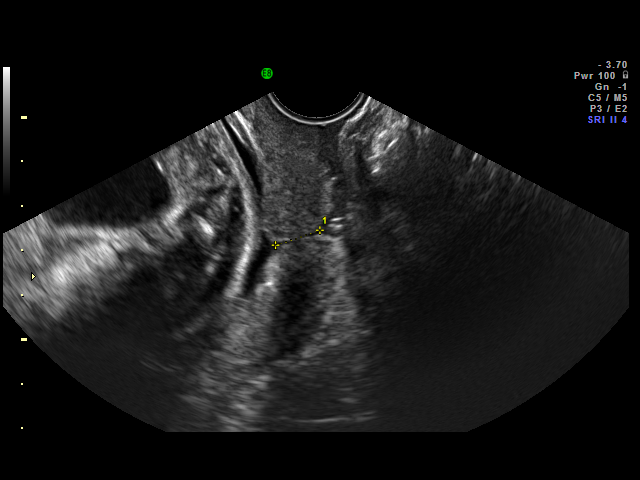
[im 17/18]
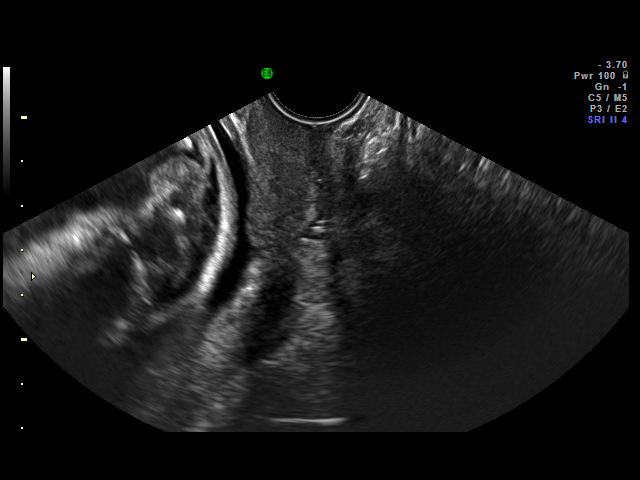
[im 18/18]
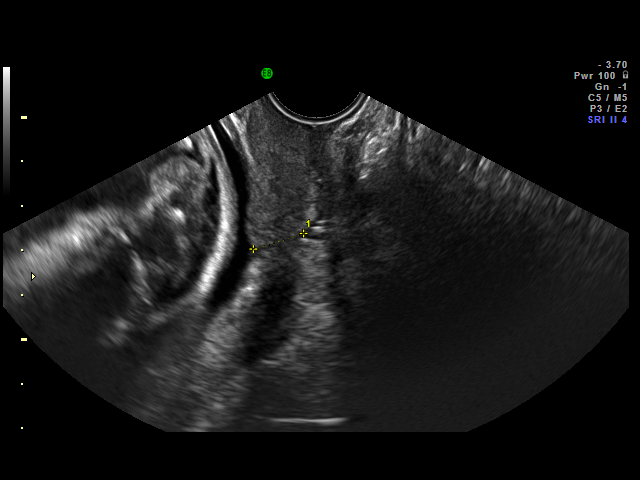

[18 of 18 positions shown; findings below may reference images not displayed]

IMPRESSION: [facility] has also been faxed to the ordering physician.

## 2010-12-14 ENCOUNTER — Other Ambulatory Visit (HOSPITAL_COMMUNITY): Payer: Self-pay | Admitting: Gastroenterology

## 2010-12-19 ENCOUNTER — Encounter: Payer: Self-pay | Admitting: Internal Medicine

## 2010-12-23 ENCOUNTER — Ambulatory Visit (HOSPITAL_COMMUNITY)
Admission: RE | Admit: 2010-12-23 | Discharge: 2010-12-23 | Disposition: A | Discharge status: Home / Self Care | Payer: Managed Care, Other (non HMO) | Source: Ambulatory Visit | Attending: Gastroenterology | Admitting: Gastroenterology

## 2010-12-23 ENCOUNTER — Encounter (HOSPITAL_COMMUNITY)
Admission: RE | Admit: 2010-12-23 | Discharge: 2010-12-23 | Disposition: A | Discharge status: Home / Self Care | Payer: Managed Care, Other (non HMO) | Source: Ambulatory Visit | Attending: Gastroenterology | Admitting: Gastroenterology

## 2010-12-23 DIAGNOSIS — R109 Unspecified abdominal pain: Secondary | ICD-10-CM | POA: Insufficient documentation

## 2010-12-23 MED ORDER — TECHNETIUM TC 99M MEBROFENIN IV KIT
5.0000 | PACK | Freq: Once | INTRAVENOUS | Status: AC | PRN
  Administered 2010-12-23: 5 via INTRAVENOUS

## 2011-01-27 ENCOUNTER — Other Ambulatory Visit: Payer: Self-pay | Admitting: Internal Medicine

## 2011-03-10 ENCOUNTER — Telehealth: Payer: Self-pay

## 2011-03-10 NOTE — Telephone Encounter (Signed)
Flu Shot [employer]. [employer]. Date 11/2011 Lot Number [id number] Influenza Left Deltoid Given 03/10/2011

## 2011-03-29 LAB — CBC
Hemoglobin: 14.4
RBC: 4.51
WBC: 10.3

## 2011-07-17 ENCOUNTER — Other Ambulatory Visit: Payer: Self-pay

## 2011-07-17 MED ORDER — OLMESARTAN MEDOXOMIL 40 MG PO TABS: 40.0000 mg | ORAL_TABLET | Freq: Every day | ORAL | Status: DC

## 2011-10-16 ENCOUNTER — Other Ambulatory Visit: Payer: Self-pay | Admitting: Internal Medicine

## 2011-10-18 ENCOUNTER — Other Ambulatory Visit: Payer: Self-pay | Admitting: Internal Medicine

## 2011-11-17 ENCOUNTER — Telehealth: Payer: Self-pay

## 2011-11-17 DIAGNOSIS — Z0000 Encounter for general adult medical examination without abnormal findings: Secondary | ICD-10-CM

## 2011-11-17 NOTE — Telephone Encounter (Signed)
Orders done

## 2011-11-17 NOTE — Telephone Encounter (Signed)
Message copied by Pincus Sanes on Fri Nov 17, 2011  3:54 PM ------      Message from: Newell Coral      Created: Fri Nov 17, 2011  3:34 PM       The pt scheduled a cpe and is hoping to get labs and her vitamin d level

## 2011-11-18 ENCOUNTER — Encounter: Payer: Self-pay | Admitting: Internal Medicine

## 2011-11-18 DIAGNOSIS — Z0001 Encounter for general adult medical examination with abnormal findings: Secondary | ICD-10-CM | POA: Insufficient documentation

## 2011-11-18 DIAGNOSIS — Z0000 Encounter for general adult medical examination without abnormal findings: Secondary | ICD-10-CM | POA: Insufficient documentation

## 2011-11-21 ENCOUNTER — Ambulatory Visit (INDEPENDENT_AMBULATORY_CARE_PROVIDER_SITE_OTHER): Payer: Managed Care, Other (non HMO) | Admitting: Internal Medicine

## 2011-11-21 ENCOUNTER — Encounter: Payer: Self-pay | Admitting: Internal Medicine

## 2011-11-21 ENCOUNTER — Other Ambulatory Visit (INDEPENDENT_AMBULATORY_CARE_PROVIDER_SITE_OTHER): Payer: Managed Care, Other (non HMO)

## 2011-11-21 VITALS — BP 100/80 | HR 85 | Temp 98.7°F | Ht 65.0 in | Wt 203.5 lb

## 2011-11-21 DIAGNOSIS — Z0000 Encounter for general adult medical examination without abnormal findings: Secondary | ICD-10-CM

## 2011-11-21 DIAGNOSIS — I10 Essential (primary) hypertension: Secondary | ICD-10-CM

## 2011-11-21 DIAGNOSIS — R079 Chest pain, unspecified: Secondary | ICD-10-CM

## 2011-11-21 LAB — LIPID PANEL
HDL: 51.5 mg/dL (ref >39.00)
LDL Cholesterol: 91 mg/dL (ref 0–99)
Total CHOL/HDL Ratio: 3
Triglycerides: 148 mg/dL (ref 0.0–149.0)

## 2011-11-21 LAB — CBC WITH DIFFERENTIAL/PLATELET
Basophils Relative: 0.2 % (ref 0.0–3.0)
Eosinophils Relative: 1 % (ref 0.0–5.0)
HCT: 38.4 % (ref 36.0–46.0)
Lymphs Abs: 2.2 10*3/uL (ref 0.7–4.0)
MCV: 84.9 fl (ref 78.0–100.0)
Monocytes Absolute: 0.6 10*3/uL (ref 0.1–1.0)
Neutro Abs: 7.3 10*3/uL (ref 1.4–7.7)
RBC: 4.52 Mil/uL (ref 3.87–5.11)
WBC: 10.3 10*3/uL (ref 4.5–10.5)

## 2011-11-21 LAB — URINALYSIS, ROUTINE W REFLEX MICROSCOPIC
Ketones, ur: NEGATIVE
Specific Gravity, Urine: <=1.005 (ref 1.000–1.030)
Urobilinogen, UA: 0.2 (ref 0.0–1.0)

## 2011-11-21 LAB — TSH: TSH: 1.33 u[IU]/mL (ref 0.35–5.50)

## 2011-11-21 LAB — BASIC METABOLIC PANEL
BUN: 10 mg/dL (ref 6–23)
Calcium: 8.9 mg/dL (ref 8.4–10.5)
Creatinine, Ser: 0.5 mg/dL (ref 0.4–1.2)
GFR: 137.66 mL/min (ref >60.00)
Glucose, Bld: 81 mg/dL (ref 70–99)

## 2011-11-21 LAB — HEPATIC FUNCTION PANEL
AST: 17 U/L (ref 0–37)
Albumin: 4 g/dL (ref 3.5–5.2)

## 2011-11-21 MED ORDER — OLMESARTAN MEDOXOMIL-HCTZ 40-12.5 MG PO TABS: 1.0000 | ORAL_TABLET | Freq: Every day | ORAL | Status: DC

## 2011-11-21 NOTE — Assessment & Plan Note (Addendum)
ECG reviewed as per emr, c/w msk , tylenol prn,  to f/u any worsening symptoms or concerns

## 2011-11-21 NOTE — Patient Instructions (Signed)
Your EKG was ok today Your labs were ok, but your Vit D level is pending Please keep your appointments with your specialists as you have planned - GYN Your medication was adjusted to Benicar HCT, and you have the samples as well Please continue your efforts at being more active, low cholesterol diet, and weight control. Please return in 1 year for your yearly visit, or sooner if needed, with Lab testing done 3-5 days before

## 2011-11-22 ENCOUNTER — Other Ambulatory Visit: Payer: Self-pay | Admitting: Internal Medicine

## 2011-11-22 ENCOUNTER — Encounter: Payer: Self-pay | Admitting: Internal Medicine

## 2011-11-22 MED ORDER — ERGOCALCIFEROL 1.25 MG (50000 UT) PO CAPS: ORAL_CAPSULE | ORAL | Status: DC

## 2011-11-26 ENCOUNTER — Encounter: Payer: Self-pay | Admitting: Internal Medicine

## 2011-11-26 NOTE — Assessment & Plan Note (Signed)

## 2011-11-26 NOTE — Progress Notes (Signed)
Subjective:    Patient ID: Ashley Wyatt, female    DOB: 1976/03/20, 36 y.o.   MRN: [id number]  HPI  Here for wellness and f/u;  Overall doing ok;  Pt denies CP, worsening SOB, DOE, wheezing, orthopnea, PND, worsening LE edema, palpitations, dizziness or syncope.  Pt denies neurological change such as new Headache, facial or extremity weakness.  Pt denies polydipsia, polyuria, or low sugar symptoms. Pt states overall good compliance with treatment and medications, good tolerability, and trying to follow lower cholesterol diet.  Pt denies worsening depressive symptoms, suicidal ideation or panic. No fever, wt loss, night sweats, loss of appetite, or other constitutional symptoms.  Pt states good ability with ADL's, low fall risk, home safety reviewed and adequate, no significant changes in hearing or vision, and occasionally active with exercise.  Has had recent upper abd pain, intermittent mild now improved, is s/p H pylori tx.  Asks for Vit D re-check, has been low.   BP at workplace has been < 140/90, wants to consolidate meds.  Works as Teacher, early years/pre.  Has also had mild intermittent left upper chest pain, worse to move the left shoulder, nonpleuritic but no radiation, assoc with sob, diaphoresis, exercise, n/v, palps. Past Medical History  Diagnosis Date  . HYPERCHOLESTEROLEMIA 08/20/2008    Qualifier: Diagnosis of  By: Everardo All MD, Cleophas Dunker   . ANEMIA-IRON DEFICIENCY 11/01/2009    Qualifier: Diagnosis of  By: [name] MD, Len Blalock   . HYPERTENSION 02/11/2007    Qualifier: Diagnosis of  By: Everardo All MD, Cleophas Dunker   . ALLERGIC RHINITIS 08/20/2008    Qualifier: Diagnosis of  By: Everardo All MD, Cleophas Dunker   . VITAMIN D DEFICIENCY 11/01/2009    Qualifier: Diagnosis of  By: [name] MD, Len Blalock   . ECZEMA 08/20/2008    Qualifier: Diagnosis of  By: Everardo All MD, Cleophas Dunker DIABETES MELLITUS, GESTATIONAL, HX OF 11/01/2009    Qualifier: Diagnosis of  By: [name] MD, Len Blalock   . STRABISMUS 06/30/2010    Qualifier: Diagnosis of  By: [name] MD,  Len Blalock    Past Surgical History  Procedure Date  . Tonsillectomy   . Right knee surgury     arthroscopy    reports that she has never smoked. She has never used smokeless tobacco. She reports that she does not drink alcohol or use illicit drugs. family history includes Breast cancer in an unspecified family member and Hypertension in an unspecified family member. No Known Allergies Current Outpatient Prescriptions on File Prior to Visit  Medication Sig Dispense Refill  . losartan (COZAAR) 100 MG tablet Take 1 tablet (100 mg total) by mouth daily.  90 tablet  3  . olmesartan-hydrochlorothiazide (BENICAR HCT) 40-12.5 MG per tablet Take 1 tablet by mouth daily.  90 tablet  3   Review of Systems Review of Systems  Constitutional: Negative for diaphoresis, activity change, appetite change and unexpected weight change.  HENT: Negative for hearing loss, ear pain, facial swelling, mouth sores and neck stiffness.   Eyes: Negative for pain, redness and visual disturbance.  Respiratory: Negative for shortness of breath and wheezing.   Cardiovascular: Negative for chest pain and palpitations.  Gastrointestinal: Negative for diarrhea, blood in stool, abdominal distention and rectal pain.  Genitourinary: Negative for hematuria, flank pain and decreased urine volume.  Musculoskeletal: Negative for myalgias and joint swelling.  Skin: Negative for color change and wound.  Neurological: Negative for syncope and numbness.  Hematological: Negative for adenopathy.  Psychiatric/Behavioral:  Negative for hallucinations, self-injury, decreased concentration and agitation.      Objective:   Physical Exam BP 100/80  Pulse 85  Temp(Src) 98.7 F (37.1 C) (Oral)  Ht 5\' 5"  (1.651 m)  Wt 203 lb 8 oz (92.307 kg)  BMI 33.86 kg/m2  SpO2 97%  LMP 10/31/2011 Physical Exam  VS noted Constitutional: Pt is oriented to person, place, and time. Appears well-developed and well-nourished.  HENT:  Head:  Normocephalic and atraumatic.  Right Ear: External ear normal.  Left Ear: External ear normal.  Nose: Nose normal.  Mouth/Throat: Oropharynx is clear and moist.  Eyes: Conjunctivae and EOM are normal. Pupils are equal, round, and reactive to light.  Neck: Normal range of motion. Neck supple. No JVD present. No tracheal deviation present.  Cardiovascular: Normal rate, regular rhythm, normal heart sounds and intact distal pulses.   Pulmonary/Chest: Effort normal and breath sounds normal.  Abdominal: Soft. Bowel sounds are normal. There is no tenderness.  Musculoskeletal: Normal range of motion. Exhibits no edema.  Lymphadenopathy:  Has no cervical adenopathy.  Neurological: Pt is alert and oriented to person, place, and time. Pt has normal reflexes. No cranial nerve deficit.  Skin: Skin is warm and dry. No rash noted.  Psychiatric:  Has  normal mood and affect. Behavior is normal.     Assessment & Plan:

## 2011-11-26 NOTE — Assessment & Plan Note (Signed)
stable overall by hx and exam, most recent data reviewed with pt, and pt to continue medical treatment as before BP Readings from Last 3 Encounters:  11/21/11 100/80  06/30/10 140/100  11/01/09 132/82

## 2012-01-19 ENCOUNTER — Telehealth: Payer: Self-pay

## 2012-01-19 MED ORDER — OLMESARTAN MEDOXOMIL-HCTZ 40-12.5 MG PO TABS: 1.0000 | ORAL_TABLET | Freq: Every day | ORAL | Status: DC

## 2012-01-19 NOTE — Telephone Encounter (Signed)
rx filled

## 2012-06-11 ENCOUNTER — Other Ambulatory Visit: Payer: Self-pay

## 2012-06-11 MED ORDER — CETIRIZINE HCL 10 MG PO TABS: 10.0000 mg | ORAL_TABLET | Freq: Every day | ORAL | Status: DC

## 2012-07-08 ENCOUNTER — Telehealth: Payer: Self-pay | Admitting: Internal Medicine

## 2012-07-08 NOTE — Telephone Encounter (Signed)
Pt is requesting an RX for phenteramine for weight loss to be called in to [facility] on [address].

## 2012-07-08 NOTE — Telephone Encounter (Signed)
Sorry, this medication is not meant to be a long term medication, and is usually limited to 3 mo only

## 2012-07-09 MED ORDER — PHENTERMINE HCL 37.5 MG PO CAPS: 37.5000 mg | ORAL_CAPSULE | ORAL | Status: DC

## 2012-07-09 NOTE — Telephone Encounter (Signed)
Called the patient left a detailed message prescription has been filled and hardcopy can be picked up at the front desk.

## 2012-07-09 NOTE — Telephone Encounter (Signed)
Patient stated she wanted only for one month to try it and also would be willing to come in for appointment if necessary. Please advise

## 2012-07-09 NOTE — Telephone Encounter (Signed)
Sorry, I was assuming this was a refill  OK for new rx - Done hardcopy to robin

## 2012-07-09 NOTE — Telephone Encounter (Signed)
Called left message to call back 

## 2012-08-19 ENCOUNTER — Telehealth: Payer: Self-pay | Admitting: Internal Medicine

## 2012-08-19 NOTE — Telephone Encounter (Signed)
Attempted call back regarding back pain.  Left message on answering machine to call if still needs assistance.

## 2012-12-13 ENCOUNTER — Other Ambulatory Visit: Payer: Self-pay | Admitting: Internal Medicine

## 2013-04-24 ENCOUNTER — Telehealth: Payer: Self-pay

## 2013-04-24 DIAGNOSIS — Z0000 Encounter for general adult medical examination without abnormal findings: Secondary | ICD-10-CM

## 2013-04-24 NOTE — Telephone Encounter (Signed)
cpx labs entered  

## 2013-05-27 ENCOUNTER — Other Ambulatory Visit: Payer: Self-pay | Admitting: Internal Medicine

## 2013-07-03 ENCOUNTER — Ambulatory Visit: Payer: Managed Care, Other (non HMO) | Admitting: Internal Medicine

## 2013-07-04 ENCOUNTER — Ambulatory Visit (INDEPENDENT_AMBULATORY_CARE_PROVIDER_SITE_OTHER): Payer: Managed Care, Other (non HMO) | Admitting: Internal Medicine

## 2013-07-04 ENCOUNTER — Encounter: Payer: Self-pay | Admitting: Internal Medicine

## 2013-07-04 VITALS — BP 112/86 | HR 85 | Temp 98.4°F | Ht 65.0 in | Wt 220.2 lb

## 2013-07-04 DIAGNOSIS — I10 Essential (primary) hypertension: Secondary | ICD-10-CM

## 2013-07-04 DIAGNOSIS — O99810 Abnormal glucose complicating pregnancy: Secondary | ICD-10-CM

## 2013-07-04 DIAGNOSIS — M2550 Pain in unspecified joint: Secondary | ICD-10-CM | POA: Insufficient documentation

## 2013-07-04 DIAGNOSIS — J209 Acute bronchitis, unspecified: Secondary | ICD-10-CM

## 2013-07-04 DIAGNOSIS — E559 Vitamin D deficiency, unspecified: Secondary | ICD-10-CM

## 2013-07-04 DIAGNOSIS — O24419 Gestational diabetes mellitus in pregnancy, unspecified control: Secondary | ICD-10-CM

## 2013-07-04 MED ORDER — OSELTAMIVIR PHOSPHATE 75 MG PO CAPS: 75.0000 mg | ORAL_CAPSULE | Freq: Two times a day (BID) | ORAL | Status: DC

## 2013-07-04 MED ORDER — HYDROCODONE-HOMATROPINE 5-1.5 MG/5ML PO SYRP: 5.0000 mL | ORAL_SOLUTION | Freq: Four times a day (QID) | ORAL | Status: DC | PRN

## 2013-07-04 MED ORDER — AZITHROMYCIN 250 MG PO TABS: ORAL_TABLET | ORAL | Status: DC

## 2013-07-04 NOTE — Assessment & Plan Note (Signed)
Asympt, pt aks for a1c with next labs

## 2013-07-04 NOTE — Patient Instructions (Signed)
Please take all new medication as prescribed Please continue all other medications as before, and refills have been done if requested. Please have the pharmacy call with any other refills you may need.  Please go to the LAB in the Basement (turn left off the elevator) for the tests to be done at your convenience for your physical next wk

## 2013-07-04 NOTE — Assessment & Plan Note (Signed)
C/o mult joint without swelling, - for labs with next visit

## 2013-07-04 NOTE — Assessment & Plan Note (Signed)
Also for vit d check, cont supplement

## 2013-07-04 NOTE — Progress Notes (Signed)
Subjective:    Patient ID: Ashley Wyatt, female    DOB: 02/04/1976, 38 y.o.   MRN: [id number]  HPI  Here with acute onset mild to mod 2 days ST, HA, general weakness and malaise, with prod cough greenish sputum, but Pt denies chest pain, increased sob or doe, wheezing, orthopnea, PND, increased LE swelling, palpitations, dizziness or syncope., also with myalgias, some diarrhea. Past Medical History  Diagnosis Date  . HYPERCHOLESTEROLEMIA 08/20/2008    Qualifier: Diagnosis of  By: Everardo AllEllison MD, Cleophas DunkerSean A   . ANEMIA-IRON DEFICIENCY 11/01/2009    Qualifier: Diagnosis of  By: Jonny RuizJohn MD, Len BlalockJames W   . HYPERTENSION 02/11/2007    Qualifier: Diagnosis of  By: Everardo AllEllison MD, Cleophas DunkerSean A   . ALLERGIC RHINITIS 08/20/2008    Qualifier: Diagnosis of  By: Everardo AllEllison MD, Cleophas DunkerSean A   . VITAMIN D DEFICIENCY 11/01/2009    Qualifier: Diagnosis of  By: Jonny RuizJohn MD, Len BlalockJames W   . ECZEMA 08/20/2008    Qualifier: Diagnosis of  By: Everardo AllEllison MD, Cleophas DunkerSean A   . DIABETES MELLITUS, GESTATIONAL, HX OF 11/01/2009    Qualifier: Diagnosis of  By: Jonny RuizJohn MD, Len BlalockJames W   . STRABISMUS 06/30/2010    Qualifier: Diagnosis of  By: Jonny RuizJohn MD, Len BlalockJames W    Past Surgical History  Procedure Laterality Date  . Tonsillectomy    . Right knee surgury      arthroscopy    reports that she has never smoked. She has never used smokeless tobacco. She reports that she does not drink alcohol or use illicit drugs. family history includes Breast cancer in an other family member; Hypertension in an other family member. No Known Allergies Current Outpatient Prescriptions on File Prior to Visit  Medication Sig Dispense Refill  . BENICAR HCT 40-12.5 MG per tablet TAKE 1 TABLET BY MOUTH DAILY - NEED TO SCHEDULE OFFICE VISIT  30 tablet  1  . cetirizine (ZYRTEC) 10 MG tablet Take 1 tablet (10 mg total) by mouth daily.  30 tablet  11  . ergocalciferol (VITAMIN D2) 50000 UNITS capsule 1 cap by mouth every 2 weeks  2 capsule  12  . losartan (COZAAR) 100 MG tablet Take 1 tablet (100 mg total)  by mouth daily.  90 tablet  3  . phentermine 37.5 MG capsule Take 1 capsule (37.5 mg total) by mouth every morning.  30 capsule  2   No current facility-administered medications on file prior to visit.   Review of Systems  Constitutional: Negative for unexpected weight change, or unusual diaphoresis  HENT: Negative for tinnitus.   Eyes: Negative for photophobia and visual disturbance.  Respiratory: Negative for choking and stridor.   Gastrointestinal: Negative for vomiting and blood in stool.  Genitourinary: Negative for hematuria and decreased urine volume.  Musculoskeletal: Negative for acute joint swelling Skin: Negative for color change and wound.  Neurological: Negative for tremors and numbness other than noted  Psychiatric/Behavioral: Negative for decreased concentration or  hyperactivity.      Objective:   Physical Exam BP 112/86  Pulse 85  Temp(Src) 98.4 F (36.9 C) (Oral)  Ht 5\' 5"  (1.651 m)  Wt 220 lb 4 oz (99.905 kg)  BMI 36.65 kg/m2  SpO2 96% VS noted, mild ill Constitutional: Pt appears well-developed and well-nourished.  HENT: Head: NCAT.  Right Ear: External ear normal.  Left Ear: External ear normal.  Bilat tm's with mild erythema.  Max sinus areas mild tender.  Pharynx with mild erythema, no exudate Eyes: Conjunctivae  and EOM are normal. Pupils are equal, round, and reactive to light.  Neck: Normal range of motion. Neck supple.  Cardiovascular: Normal rate and regular rhythm.   Pulmonary/Chest: Effort normal, breath sounds normal without rales or wheezing  Neurological: Pt is alert. Not confused  Skin: Skin is warm. No erythema.  Psychiatric: Pt behavior is normal. Thought content normal.  No synovitis    Assessment & Plan:

## 2013-07-04 NOTE — Assessment & Plan Note (Signed)
Somewhat atypical but I suspect flu like in origin, cant r/o bacterial with colored sputum, for zpack/tamiflu/cough med,  to f/u any worsening symptoms or concerns

## 2013-07-04 NOTE — Assessment & Plan Note (Signed)
stable overall by history and exam, recent data reviewed with pt, and pt to continue medical treatment as before,  to f/u any worsening symptoms or concerns BP Readings from Last 3 Encounters:  07/04/13 112/86  11/21/11 100/80  06/30/10 140/100

## 2013-07-07 ENCOUNTER — Telehealth: Payer: Self-pay | Admitting: Internal Medicine

## 2013-07-07 NOTE — Telephone Encounter (Signed)
Relevant patient education mailed to patient.  

## 2013-07-10 ENCOUNTER — Ambulatory Visit (INDEPENDENT_AMBULATORY_CARE_PROVIDER_SITE_OTHER): Payer: Managed Care, Other (non HMO)

## 2013-07-10 ENCOUNTER — Encounter: Payer: Self-pay | Admitting: Internal Medicine

## 2013-07-10 DIAGNOSIS — Z0000 Encounter for general adult medical examination without abnormal findings: Secondary | ICD-10-CM

## 2013-07-10 DIAGNOSIS — M2550 Pain in unspecified joint: Secondary | ICD-10-CM

## 2013-07-10 LAB — CBC WITH DIFFERENTIAL/PLATELET
BASOS PCT: 0.2 % (ref 0.0–3.0)
Basophils Absolute: 0 10*3/uL (ref 0.0–0.1)
Eosinophils Absolute: 0.2 10*3/uL (ref 0.0–0.7)
Eosinophils Relative: 1.8 % (ref 0.0–5.0)
HCT: 37.1 % (ref 36.0–46.0)
Hemoglobin: 12.6 g/dL (ref 12.0–15.0)
LYMPHS PCT: 12 % (ref 12.0–46.0)
Lymphs Abs: 1.5 10*3/uL (ref 0.7–4.0)
MCHC: 34 g/dL (ref 30.0–36.0)
MCV: 81.7 fl (ref 78.0–100.0)
Monocytes Absolute: 0.5 10*3/uL (ref 0.1–1.0)
Monocytes Relative: 3.8 % (ref 3.0–12.0)
NEUTROS ABS: 10.5 10*3/uL — AB (ref 1.4–7.7)
Neutrophils Relative %: 82.2 % — ABNORMAL HIGH (ref 43.0–77.0)
Platelets: 230 10*3/uL (ref 150.0–400.0)
RBC: 4.54 Mil/uL (ref 3.87–5.11)
RDW: 13.8 % (ref 11.5–14.6)
WBC: 12.7 10*3/uL — ABNORMAL HIGH (ref 4.5–10.5)

## 2013-07-10 LAB — LDL CHOLESTEROL, DIRECT: Direct LDL: 92 mg/dL

## 2013-07-10 LAB — HEPATIC FUNCTION PANEL
ALT: 21 U/L (ref 0–35)
AST: 18 U/L (ref 0–37)
Albumin: 3.5 g/dL (ref 3.5–5.2)
Alkaline Phosphatase: 74 U/L (ref 39–117)
BILIRUBIN TOTAL: 0.6 mg/dL (ref 0.3–1.2)
Bilirubin, Direct: 0.1 mg/dL (ref 0.0–0.3)
TOTAL PROTEIN: 6.8 g/dL (ref 6.0–8.3)

## 2013-07-10 LAB — URINALYSIS, ROUTINE W REFLEX MICROSCOPIC
BILIRUBIN URINE: NEGATIVE
HGB URINE DIPSTICK: NEGATIVE
Ketones, ur: NEGATIVE
LEUKOCYTES UA: NEGATIVE
Nitrite: NEGATIVE
RBC / HPF: NONE SEEN (ref 0–?)
Specific Gravity, Urine: 1.01 (ref 1.000–1.030)
Total Protein, Urine: NEGATIVE
Urine Glucose: NEGATIVE
Urobilinogen, UA: 0.2 (ref 0.0–1.0)
pH: 7 (ref 5.0–8.0)

## 2013-07-10 LAB — RHEUMATOID FACTOR: RHEUMATOID FACTOR: 12 [IU]/mL (ref <=14)

## 2013-07-10 LAB — TSH: TSH: 1.86 u[IU]/mL (ref 0.35–5.50)

## 2013-07-10 LAB — BASIC METABOLIC PANEL
BUN: 11 mg/dL (ref 6–23)
CHLORIDE: 103 meq/L (ref 96–112)
CO2: 25 meq/L (ref 19–32)
CREATININE: 0.5 mg/dL (ref 0.4–1.2)
Calcium: 8.2 mg/dL — ABNORMAL LOW (ref 8.4–10.5)
GFR: 136.47 mL/min (ref >60.00)
Glucose, Bld: 116 mg/dL — ABNORMAL HIGH (ref 70–99)
Potassium: 3.8 mEq/L (ref 3.5–5.1)
Sodium: 134 mEq/L — ABNORMAL LOW (ref 135–145)

## 2013-07-10 LAB — LIPID PANEL
CHOL/HDL RATIO: 5
Cholesterol: 164 mg/dL (ref 0–200)
HDL: 35.3 mg/dL — ABNORMAL LOW (ref >39.00)
Triglycerides: 355 mg/dL — ABNORMAL HIGH (ref 0.0–149.0)
VLDL: 71 mg/dL — AB (ref 0.0–40.0)

## 2013-07-10 LAB — SEDIMENTATION RATE: Sed Rate: 18 mm/hr (ref 0–22)

## 2013-07-11 ENCOUNTER — Ambulatory Visit (INDEPENDENT_AMBULATORY_CARE_PROVIDER_SITE_OTHER): Payer: Managed Care, Other (non HMO) | Admitting: Internal Medicine

## 2013-07-11 ENCOUNTER — Encounter: Payer: Self-pay | Admitting: Internal Medicine

## 2013-07-11 VITALS — BP 104/72 | HR 83 | Temp 97.1°F | Ht 65.0 in | Wt 222.0 lb

## 2013-07-11 DIAGNOSIS — E669 Obesity, unspecified: Secondary | ICD-10-CM

## 2013-07-11 DIAGNOSIS — E781 Pure hyperglyceridemia: Secondary | ICD-10-CM

## 2013-07-11 DIAGNOSIS — R7302 Impaired glucose tolerance (oral): Secondary | ICD-10-CM

## 2013-07-11 DIAGNOSIS — Z0000 Encounter for general adult medical examination without abnormal findings: Secondary | ICD-10-CM

## 2013-07-11 DIAGNOSIS — R7309 Other abnormal glucose: Secondary | ICD-10-CM

## 2013-07-11 HISTORY — DX: Pure hyperglyceridemia: E78.1

## 2013-07-11 HISTORY — DX: Impaired glucose tolerance (oral): R73.02

## 2013-07-11 LAB — ANA: Anti Nuclear Antibody(ANA): POSITIVE — AB

## 2013-07-11 LAB — ANTI-NUCLEAR AB-TITER (ANA TITER): ANA Titer 1: NEGATIVE

## 2013-07-11 MED ORDER — LORCASERIN HCL 10 MG PO TABS: 10.0000 mg | ORAL_TABLET | Freq: Two times a day (BID) | ORAL | Status: DC

## 2013-07-11 NOTE — Assessment & Plan Note (Signed)

## 2013-07-11 NOTE — Patient Instructions (Signed)
Please take all new medication as prescribed - the belviq Please continue all other medications as before, and refills have been done if requested. Please have the pharmacy call with any other refills you may need. Please continue your efforts at being more active, low cholesterol diet, and weight control. You are otherwise up to date with prevention measures today. Please keep your appointments with your specialists as you may have planned You are given the lab work today  Please go to the LAB in the Basement (turn left off the elevator) for the tests to be done in 2 months  You will be contacted by phone if any changes need to be made immediately.  Otherwise, you will receive a letter about your results with an explanation, but please check with MyChart first.  Please remember to sign up for My Chart if you have not done so, as this will be important to you in the future with finding out test results, communicating by private email, and scheduling acute appointments online when needed.  Please return in 1 year for your yearly visit, or sooner if needed, with Lab testing done 3-5 days before

## 2013-07-11 NOTE — Progress Notes (Signed)
Subjective:    Patient ID: Ashley PayorGayatri Laino, female    DOB: 11/30/1975, [age] y.o.   MRN: [id number]  HPI  Here for wellness and f/u;  Overall doing ok;  Pt denies CP, worsening SOB, DOE, wheezing, orthopnea, PND, worsening LE edema, palpitations, dizziness or syncope.  Pt denies neurological change such as new headache, facial or extremity weakness.  Pt denies polydipsia, polyuria, or low sugar symptoms. Pt states overall good compliance with treatment and medications, good tolerability, and has been trying to follow lower cholesterol diet.  Pt denies worsening depressive symptoms, suicidal ideation or panic. No fever, night sweats, wt loss, loss of appetite, or other constitutional symptoms.  Pt states good ability with ADL's, has low fall risk, home safety reviewed and adequate, no other significant changes in hearing or vision, and only occasionally active with exercise.  Has overall gained wt with husband traveling for work in the past 6 mo, close to 20 lbs, now he is home, she has full time job, 3 children but plans to re-start gym, and asks for belviq. Phentermine caused some jitteriness Past Medical History  Diagnosis Date  . HYPERCHOLESTEROLEMIA 08/20/2008    Qualifier: Diagnosis of  By: Everardo AllEllison MD, Cleophas DunkerSean A   . ANEMIA-IRON DEFICIENCY 11/01/2009    Qualifier: Diagnosis of  By: Jonny RuizJohn MD, Len BlalockJames W   . HYPERTENSION 02/11/2007    Qualifier: Diagnosis of  By: Everardo AllEllison MD, Cleophas DunkerSean A   . ALLERGIC RHINITIS 08/20/2008    Qualifier: Diagnosis of  By: Everardo AllEllison MD, Cleophas DunkerSean A   . VITAMIN D DEFICIENCY 11/01/2009    Qualifier: Diagnosis of  By: Jonny RuizJohn MD, Len BlalockJames W   . ECZEMA 08/20/2008    Qualifier: Diagnosis of  By: Everardo AllEllison MD, Cleophas DunkerSean A   . DIABETES MELLITUS, GESTATIONAL, HX OF 11/01/2009    Qualifier: Diagnosis of  By: Jonny RuizJohn MD, Len BlalockJames W   . STRABISMUS 06/30/2010    Qualifier: Diagnosis of  By: Jonny RuizJohn MD, Len BlalockJames W    Past Surgical History  Procedure Laterality Date  . Tonsillectomy    . Right knee surgury      arthroscopy    reports that she has never smoked. She has never used smokeless tobacco. She reports that she does not drink alcohol or use illicit drugs. family history includes Breast cancer in an other family member; Hypertension in an other family member. No Known Allergies Current Outpatient Prescriptions on File Prior to Visit  Medication Sig Dispense Refill  . BENICAR HCT 40-12.5 MG per tablet TAKE 1 TABLET BY MOUTH DAILY - NEED TO SCHEDULE OFFICE VISIT  30 tablet  1  . cetirizine (ZYRTEC) 10 MG tablet Take 1 tablet (10 mg total) by mouth daily.  30 tablet  11  . ergocalciferol (VITAMIN D2) 50000 UNITS capsule 1 cap by mouth every 2 weeks  2 capsule  12  . phentermine 37.5 MG capsule Take 1 capsule (37.5 mg total) by mouth every morning.  30 capsule  2  . losartan (COZAAR) 100 MG tablet Take 1 tablet (100 mg total) by mouth daily.  90 tablet  3   No current facility-administered medications on file prior to visit.   Review of Systems Constitutional: Negative for diaphoresis, activity change, appetite change or unexpected weight change.  HENT: Negative for hearing loss, ear pain, facial swelling, mouth sores and neck stiffness.   Eyes: Negative for pain, redness and visual disturbance.  Respiratory: Negative for shortness of breath and wheezing.   Cardiovascular: Negative for chest pain and  palpitations.  Gastrointestinal: Negative for diarrhea, blood in stool, abdominal distention or other pain Genitourinary: Negative for hematuria, flank pain or change in urine volume.  Musculoskeletal: Negative for myalgias and joint swelling.  Skin: Negative for color change and wound.  Neurological: Negative for syncope and numbness. other than noted Hematological: Negative for adenopathy.  Psychiatric/Behavioral: Negative for hallucinations, self-injury, decreased concentration and agitation.      Objective:   Physical Exam BP 104/72  Pulse 83  Temp(Src) 97.1 F (36.2 C) (Oral)  Ht 5\' 5"  (1.651 m)  Wt  222 lb (100.699 kg)  BMI 36.94 kg/m2  SpO2 98% VS noted,  Constitutional: Pt is oriented to person, place, and time. Appears well-developed and well-nourished.  Head: Normocephalic and atraumatic.  Right Ear: External ear normal.  Left Ear: External ear normal.  Nose: Nose normal.  Mouth/Throat: Oropharynx is clear and moist.  Eyes: Conjunctivae and EOM are normal. Pupils are equal, round, and reactive to light.  Neck: Normal range of motion. Neck supple. No JVD present. No tracheal deviation present.  Cardiovascular: Normal rate, regular rhythm, normal heart sounds and intact distal pulses.   Pulmonary/Chest: Effort normal and breath sounds normal.  Abdominal: Soft. Bowel sounds are normal. There is no tenderness. No HSM  Musculoskeletal: Normal range of motion. Exhibits no edema.  Lymphadenopathy:  Has no cervical adenopathy.  Neurological: Pt is alert and oriented to person, place, and time. Pt has normal reflexes. No cranial nerve deficit.  Skin: Skin is warm and dry. No rash noted.  Psychiatric:  Has  normal mood and affect. Behavior is normal.      Assessment & Plan:

## 2013-07-11 NOTE — Progress Notes (Signed)
Pre-visit discussion using our clinic review tool. No additional management support is needed unless otherwise documented below in the visit note.  

## 2013-07-11 NOTE — Assessment & Plan Note (Signed)
For belviq for wt loss

## 2013-07-23 ENCOUNTER — Other Ambulatory Visit: Payer: Self-pay | Admitting: Internal Medicine

## 2013-09-29 ENCOUNTER — Telehealth: Payer: Self-pay | Admitting: *Deleted

## 2013-09-29 DIAGNOSIS — E559 Vitamin D deficiency, unspecified: Secondary | ICD-10-CM

## 2013-09-29 NOTE — Telephone Encounter (Signed)
Done per emr 

## 2013-09-29 NOTE — Telephone Encounter (Signed)
Pt called requesting a Vitamin D lab ordered.  Please advise

## 2013-09-29 NOTE — Telephone Encounter (Signed)
Patient informed. 

## 2013-09-30 ENCOUNTER — Other Ambulatory Visit (INDEPENDENT_AMBULATORY_CARE_PROVIDER_SITE_OTHER): Payer: Managed Care, Other (non HMO)

## 2013-09-30 ENCOUNTER — Telehealth: Payer: Self-pay | Admitting: Internal Medicine

## 2013-09-30 DIAGNOSIS — D72829 Elevated white blood cell count, unspecified: Secondary | ICD-10-CM

## 2013-09-30 DIAGNOSIS — E781 Pure hyperglyceridemia: Secondary | ICD-10-CM

## 2013-09-30 DIAGNOSIS — R7302 Impaired glucose tolerance (oral): Secondary | ICD-10-CM

## 2013-09-30 DIAGNOSIS — E559 Vitamin D deficiency, unspecified: Secondary | ICD-10-CM

## 2013-09-30 DIAGNOSIS — E871 Hypo-osmolality and hyponatremia: Secondary | ICD-10-CM

## 2013-09-30 DIAGNOSIS — R7309 Other abnormal glucose: Secondary | ICD-10-CM

## 2013-09-30 LAB — BASIC METABOLIC PANEL
BUN: 9 mg/dL (ref 6–23)
CALCIUM: 9 mg/dL (ref 8.4–10.5)
CO2: 29 mEq/L (ref 19–32)
Chloride: 104 mEq/L (ref 96–112)
Creatinine, Ser: 0.5 mg/dL (ref 0.4–1.2)
GFR: 160.52 mL/min (ref >60.00)
Glucose, Bld: 75 mg/dL (ref 70–99)
POTASSIUM: 4 meq/L (ref 3.5–5.1)
SODIUM: 138 meq/L (ref 135–145)

## 2013-09-30 LAB — CBC WITH DIFFERENTIAL/PLATELET
Basophils Absolute: 0 10*3/uL (ref 0.0–0.1)
Basophils Relative: 0.2 % (ref 0.0–3.0)
Eosinophils Absolute: 0.2 10*3/uL (ref 0.0–0.7)
Eosinophils Relative: 2.3 % (ref 0.0–5.0)
HEMATOCRIT: 37.3 % (ref 36.0–46.0)
Hemoglobin: 12.4 g/dL (ref 12.0–15.0)
LYMPHS ABS: 2.9 10*3/uL (ref 0.7–4.0)
Lymphocytes Relative: 27.6 % (ref 12.0–46.0)
MCHC: 33.2 g/dL (ref 30.0–36.0)
MCV: 82.8 fl (ref 78.0–100.0)
MONO ABS: 0.5 10*3/uL (ref 0.1–1.0)
Monocytes Relative: 5 % (ref 3.0–12.0)
NEUTROS PCT: 64.9 % (ref 43.0–77.0)
Neutro Abs: 6.7 10*3/uL (ref 1.4–7.7)
Platelets: 264 10*3/uL (ref 150.0–400.0)
RBC: 4.5 Mil/uL (ref 3.87–5.11)
RDW: 15.8 % — AB (ref 11.5–14.6)
WBC: 10.4 10*3/uL (ref 4.5–10.5)

## 2013-09-30 LAB — HEMOGLOBIN A1C: Hgb A1c MFr Bld: 5.9 % (ref 4.6–6.5)

## 2013-09-30 LAB — LIPID PANEL
CHOL/HDL RATIO: 3
Cholesterol: 153 mg/dL (ref 0–200)
HDL: 45.4 mg/dL (ref >39.00)
LDL Cholesterol: 75 mg/dL (ref 0–99)
TRIGLYCERIDES: 162 mg/dL — AB (ref 0.0–149.0)
VLDL: 32.4 mg/dL (ref 0.0–40.0)

## 2013-09-30 NOTE — Telephone Encounter (Signed)
Labs ordered as per pt request at walkin today

## 2013-10-01 ENCOUNTER — Encounter: Payer: Self-pay | Admitting: Internal Medicine

## 2013-10-01 LAB — VITAMIN D 25 HYDROXY (VIT D DEFICIENCY, FRACTURES): Vit D, 25-Hydroxy: 24 ng/mL — ABNORMAL LOW (ref 30–89)

## 2013-12-29 ENCOUNTER — Other Ambulatory Visit: Payer: Self-pay | Admitting: Internal Medicine

## 2013-12-30 MED ORDER — IRBESARTAN-HYDROCHLOROTHIAZIDE 300-12.5 MG PO TABS: 1.0000 | ORAL_TABLET | Freq: Every day | ORAL | Status: DC

## 2013-12-30 NOTE — Telephone Encounter (Signed)
Ok change to Fingalavalide due to cost

## 2014-05-21 ENCOUNTER — Other Ambulatory Visit: Payer: Self-pay | Admitting: Internal Medicine

## 2014-06-25 ENCOUNTER — Telehealth: Payer: Self-pay

## 2014-06-25 MED ORDER — MEFLOQUINE HCL 250 MG PO TABS: ORAL_TABLET | ORAL | Status: DC

## 2014-06-25 NOTE — Telephone Encounter (Signed)
Done erx 

## 2014-06-25 NOTE — Telephone Encounter (Signed)
Patient is going to [place] for 6 weeks and needs something sent in for Malaria

## 2015-06-27 ENCOUNTER — Other Ambulatory Visit: Payer: Self-pay | Admitting: Internal Medicine

## 2015-07-28 ENCOUNTER — Ambulatory Visit: Payer: Managed Care, Other (non HMO) | Admitting: Nurse Practitioner

## 2015-07-29 ENCOUNTER — Ambulatory Visit: Payer: Managed Care, Other (non HMO) | Admitting: Internal Medicine

## 2015-09-20 ENCOUNTER — Ambulatory Visit: Payer: Managed Care, Other (non HMO) | Admitting: Adult Health

## 2016-04-14 ENCOUNTER — Other Ambulatory Visit: Payer: Self-pay | Admitting: Sports Medicine

## 2016-04-14 NOTE — Telephone Encounter (Signed)
Rx refill request

## 2016-06-06 ENCOUNTER — Encounter: Payer: Self-pay | Admitting: Internal Medicine

## 2016-06-06 ENCOUNTER — Other Ambulatory Visit (INDEPENDENT_AMBULATORY_CARE_PROVIDER_SITE_OTHER): Payer: 59

## 2016-06-06 ENCOUNTER — Ambulatory Visit (INDEPENDENT_AMBULATORY_CARE_PROVIDER_SITE_OTHER): Payer: 59 | Admitting: Internal Medicine

## 2016-06-06 VITALS — BP 128/78 | HR 76 | Temp 98.1°F | Resp 20 | Wt 240.0 lb

## 2016-06-06 DIAGNOSIS — E669 Obesity, unspecified: Secondary | ICD-10-CM | POA: Diagnosis not present

## 2016-06-06 DIAGNOSIS — E7800 Pure hypercholesterolemia, unspecified: Secondary | ICD-10-CM

## 2016-06-06 DIAGNOSIS — I10 Essential (primary) hypertension: Secondary | ICD-10-CM | POA: Diagnosis not present

## 2016-06-06 DIAGNOSIS — R7302 Impaired glucose tolerance (oral): Secondary | ICD-10-CM | POA: Diagnosis not present

## 2016-06-06 DIAGNOSIS — R739 Hyperglycemia, unspecified: Secondary | ICD-10-CM

## 2016-06-06 DIAGNOSIS — E559 Vitamin D deficiency, unspecified: Secondary | ICD-10-CM | POA: Diagnosis not present

## 2016-06-06 DIAGNOSIS — Z0001 Encounter for general adult medical examination with abnormal findings: Secondary | ICD-10-CM

## 2016-06-06 DIAGNOSIS — R7309 Other abnormal glucose: Secondary | ICD-10-CM

## 2016-06-06 DIAGNOSIS — R7989 Other specified abnormal findings of blood chemistry: Secondary | ICD-10-CM

## 2016-06-06 DIAGNOSIS — K219 Gastro-esophageal reflux disease without esophagitis: Secondary | ICD-10-CM | POA: Diagnosis not present

## 2016-06-06 LAB — URINALYSIS, ROUTINE W REFLEX MICROSCOPIC
BILIRUBIN URINE: NEGATIVE
Hgb urine dipstick: NEGATIVE
KETONES UR: NEGATIVE
Leukocytes, UA: NEGATIVE
NITRITE: NEGATIVE
PH: 6 (ref 5.0–8.0)
RBC / HPF: NONE SEEN (ref 0–?)
Specific Gravity, Urine: <=1.005 — AB (ref 1.000–1.030)
Total Protein, Urine: NEGATIVE
URINE GLUCOSE: NEGATIVE
UROBILINOGEN UA: 0.2 (ref 0.0–1.0)
WBC UA: NONE SEEN (ref 0–?)

## 2016-06-06 LAB — CBC WITH DIFFERENTIAL/PLATELET
BASOS ABS: 0 10*3/uL (ref 0.0–0.1)
Basophils Relative: 0.3 % (ref 0.0–3.0)
EOS ABS: 0.3 10*3/uL (ref 0.0–0.7)
Eosinophils Relative: 2.3 % (ref 0.0–5.0)
HEMATOCRIT: 36.3 % (ref 36.0–46.0)
HEMOGLOBIN: 12.1 g/dL (ref 12.0–15.0)
LYMPHS PCT: 19.4 % (ref 12.0–46.0)
Lymphs Abs: 2.2 10*3/uL (ref 0.7–4.0)
MCHC: 33.5 g/dL (ref 30.0–36.0)
MCV: 78.3 fl (ref 78.0–100.0)
Monocytes Absolute: 0.6 10*3/uL (ref 0.1–1.0)
Monocytes Relative: 5.1 % (ref 3.0–12.0)
Neutro Abs: 8.4 10*3/uL — ABNORMAL HIGH (ref 1.4–7.7)
Neutrophils Relative %: 72.9 % (ref 43.0–77.0)
PLATELETS: 289 10*3/uL (ref 150.0–400.0)
RBC: 4.63 Mil/uL (ref 3.87–5.11)
RDW: 15.3 % (ref 11.5–15.5)
WBC: 11.5 10*3/uL — AB (ref 4.0–10.5)

## 2016-06-06 LAB — LIPID PANEL
Cholesterol: 166 mg/dL (ref 0–200)
HDL: 42.2 mg/dL (ref >39.00)
NONHDL: 123.91
TRIGLYCERIDES: 270 mg/dL — AB (ref 0.0–149.0)
Total CHOL/HDL Ratio: 4
VLDL: 54 mg/dL — ABNORMAL HIGH (ref 0.0–40.0)

## 2016-06-06 LAB — BASIC METABOLIC PANEL
BUN: 9 mg/dL (ref 6–23)
CHLORIDE: 100 meq/L (ref 96–112)
CO2: 31 meq/L (ref 19–32)
CREATININE: 0.58 mg/dL (ref 0.40–1.20)
Calcium: 9 mg/dL (ref 8.4–10.5)
GFR: 121.21 mL/min (ref >60.00)
Glucose, Bld: 101 mg/dL — ABNORMAL HIGH (ref 70–99)
Potassium: 3.9 mEq/L (ref 3.5–5.1)
Sodium: 138 mEq/L (ref 135–145)

## 2016-06-06 LAB — HEPATIC FUNCTION PANEL
ALT: 12 U/L (ref 0–35)
AST: 11 U/L (ref 0–37)
Albumin: 4.1 g/dL (ref 3.5–5.2)
Alkaline Phosphatase: 86 U/L (ref 39–117)
Bilirubin, Direct: 0.1 mg/dL (ref 0.0–0.3)
TOTAL PROTEIN: 6.8 g/dL (ref 6.0–8.3)
Total Bilirubin: 0.4 mg/dL (ref 0.2–1.2)

## 2016-06-06 LAB — POCT GLYCOSYLATED HEMOGLOBIN (HGB A1C): Hemoglobin A1C: 5.6

## 2016-06-06 LAB — VITAMIN D 25 HYDROXY (VIT D DEFICIENCY, FRACTURES): VITD: 21.6 ng/mL — ABNORMAL LOW (ref 30.00–100.00)

## 2016-06-06 LAB — LDL CHOLESTEROL, DIRECT: Direct LDL: 95 mg/dL

## 2016-06-06 LAB — TSH: TSH: 1.13 u[IU]/mL (ref 0.35–4.50)

## 2016-06-06 MED ORDER — PHENTERMINE HCL 37.5 MG PO CAPS: 37.5000 mg | ORAL_CAPSULE | ORAL | 2 refills | Status: DC

## 2016-06-06 MED ORDER — PANTOPRAZOLE SODIUM 20 MG PO TBEC: 20.0000 mg | DELAYED_RELEASE_TABLET | Freq: Two times a day (BID) | ORAL | 3 refills | Status: DC

## 2016-06-06 MED ORDER — OLMESARTAN MEDOXOMIL-HCTZ 40-12.5 MG PO TABS: 1.0000 | ORAL_TABLET | Freq: Every day | ORAL | 3 refills | Status: DC

## 2016-06-06 NOTE — Progress Notes (Signed)
Pre visit review using our clinic review tool, if applicable. No additional management support is needed unless otherwise documented below in the visit note. 

## 2016-06-06 NOTE — Progress Notes (Signed)
Subjective:    Patient ID: Ashley PayorGayatri Wyatt, female    DOB: 03/10/1976, 40 y.o.   MRN: [id number]  HPI  Here for wellness and f/u;  Overall doing ok;  Pt denies Chest pain, worsening SOB, DOE, orthopnea, PND, worsening LE edema, palpitations, dizziness or syncope.  Pt denies neurological change such as new headache, facial or extremity weakness.  Pt denies polydipsia, polyuria, or low sugar symptoms. Pt states overall good compliance with treatment and medications, good tolerability, and has been trying to follow appropriate diet.  Pt denies worsening depressive symptoms, suicidal ideation or panic. No fever, night sweats, wt loss, loss of appetite, or other constitutional symptoms.  Pt states good ability with ADL's, has low fall risk, home safety reviewed and adequate, no other significant changes in hearing or vision, and only occasionally active with exercise.  Brother is pulmonary MD , had some wheezing last winter with dry cough, no SOB, neg exam at [facility]  (no cxr) but started taking her husband's protonix and helping.  Last  Pap last x 2 yrs and plans to call.  Had mammogram in [place] 1 yrs ago, but f/u mammogram here with GYN is negatvie.   BP has been improving recently, after having her twins with gestational DM , then started HTn med post partum, good compliance with benicar HCT.  Wt Readings from Last 3 Encounters:  06/06/16 240 lb (108.9 kg)  07/11/13 222 lb (100.7 kg)  07/04/13 220 lb 4 oz (99.9 kg)  Asks for phentermine for wt loss, has already tried belviq, qsymia, and also had HA with topiramate.   Also had elvated hco3 elevation, so pulmonary brother suggeasted looking into sleep apnea,? Not sure if snoring,   Has recurring right temple pain, spasm like but wondering if related to sinus pressure, motrin no help but sudafed helps, but also grinds teeth at night. Has mouth guard but tends to fall out during sleep.  Past Medical History:  Diagnosis Date  . ALLERGIC RHINITIS 08/20/2008   Qualifier: Diagnosis of  By: Everardo AllEllison MD, Cleophas DunkerSean A   . ANEMIA-IRON DEFICIENCY 11/01/2009   Qualifier: Diagnosis of  By: Jonny RuizJohn MD, Len BlalockJames W   . DIABETES MELLITUS, GESTATIONAL, HX OF 11/01/2009   Qualifier: Diagnosis of  By: Jonny RuizJohn MD, Len BlalockJames W   . ECZEMA 08/20/2008   Qualifier: Diagnosis of  By: Everardo AllEllison MD, Cleophas DunkerSean A   . HYPERCHOLESTEROLEMIA 08/20/2008   Qualifier: Diagnosis of  By: Everardo AllEllison MD, Cleophas DunkerSean A   . HYPERTENSION 02/11/2007   Qualifier: Diagnosis of  By: Everardo AllEllison MD, Cleophas DunkerSean A   . Hypertriglyceridemia 07/11/2013  . Impaired glucose tolerance 07/11/2013  . STRABISMUS 06/30/2010   Qualifier: Diagnosis of  By: Jonny RuizJohn MD, Len BlalockJames W   . VITAMIN D DEFICIENCY 11/01/2009   Qualifier: Diagnosis of  By: Jonny RuizJohn MD, Len BlalockJames W    Past Surgical History:  Procedure Laterality Date  . right knee surgury     arthroscopy  . TONSILLECTOMY      reports that she has never smoked. She has never used smokeless tobacco. She reports that she does not drink alcohol or use drugs. family history is not on file. No Known Allergies No current outpatient prescriptions on file prior to visit.   No current facility-administered medications on file prior to visit.    Review of Systems Constitutional: Negative for increased diaphoresis, or other activity, appetite or siginficant weight change other than noted HENT: Negative for worsening hearing loss, ear pain, facial swelling, mouth sores and neck stiffness.  Eyes: Negative for other worsening pain, redness or visual disturbance.  Respiratory: Negative for choking or stridor Cardiovascular: Negative for other chest pain and palpitations.  Gastrointestinal: Negative for worsening diarrhea, blood in stool, or abdominal distention Genitourinary: Negative for hematuria, flank pain or change in urine volume.  Musculoskeletal: Negative for myalgias or other joint complaints.  Skin: Negative for other color change and wound or drainage.  Neurological: Negative for syncope and numbness. other  than noted Hematological: Negative for adenopathy. or other swelling Psychiatric/Behavioral: Negative for hallucinations, SI, self-injury, decreased concentration or other worsening agitation.  All other system neg per pt    Objective:   Physical Exam BP 128/78   Pulse 76   Temp 98.1 F (36.7 C) (Oral)   Resp 20   Wt 240 lb (108.9 kg)   SpO2 94%   BMI 39.94 kg/m  VS noted, morbid obese Constitutional: Pt is oriented to person, place, and time. Appears well-developed and well-nourished, in no significant distress Head: Normocephalic and atraumatic  Eyes: Conjunctivae and EOM are normal. Pupils are equal, round, and reactive to light Right Ear: External ear normal.  Left Ear: External ear normal Nose: Nose normal.  Mouth/Throat: Oropharynx is clear and moist  Neck: Normal range of motion. Neck supple. No JVD present. No tracheal deviation present or significant neck LA or mass Cardiovascular: Normal rate, regular rhythm, normal heart sounds and intact distal pulses.   Pulmonary/Chest: Effort normal and breath sounds without rales or wheezing  Abdominal: Soft. Bowel sounds are normal. NT. No HSM  Musculoskeletal: Normal range of motion. Exhibits no edema Lymphadenopathy: Has no cervical adenopathy.  Neurological: Pt is alert and oriented to person, place, and time. Pt has normal reflexes. No cranial nerve deficit. Motor grossly intact Skin: Skin is warm and dry. No rash noted or new ulcers Psychiatric:  Has normal mood and affect. Behavior is normal.  No other new exam findings  POCT A1c - 5.6    Assessment & Plan:

## 2016-06-06 NOTE — Patient Instructions (Signed)
Please take all new medication as prescribed - the phentermine, and the protonix  Please continue all other medications as before, and refills have been done if requested.  Please have the pharmacy call with any other refills you may need.  Please continue your efforts at being more active, low cholesterol diet, and weight control.  You are otherwise up to date with prevention measures today.  Please keep your appointments with your specialists as you may have planned  Please go to the LAB in the Basement (turn left off the elevator) for the tests to be done today  You will be contacted by phone if any changes need to be made immediately.  Otherwise, you will receive a letter about your results with an explanation, but please check with MyChart first.  Please remember to sign up for MyChart if you have not done so, as this will be important to you in the future with finding out test results, communicating by private email, and scheduling acute appointments online when needed.  Please return in 1 year for your yearly visit, or sooner if needed, with Lab testing done 3-5 days before

## 2016-06-08 ENCOUNTER — Telehealth: Payer: Self-pay | Admitting: Emergency Medicine

## 2016-06-08 NOTE — Telephone Encounter (Signed)
Pt called and wants to know if you can mail a copy of her lab results to her. Thanks.

## 2016-06-08 NOTE — Telephone Encounter (Signed)
These have been placed in the mail.

## 2016-06-11 NOTE — Assessment & Plan Note (Signed)
Lab Results  Component Value Date   HGBA1C 5.6 06/06/2016   stable overall by history and exam, recent data reviewed with pt, and pt to continue medical treatment as before,  to f/u any worsening symptoms or concerns

## 2016-06-11 NOTE — Assessment & Plan Note (Signed)
Also for vit d check,  to f/u any worsening symptoms or concerns 

## 2016-06-11 NOTE — Assessment & Plan Note (Signed)
stable overall by history and exam, recent data reviewed with pt, and pt to continue medical treatment as before,  to f/u any worsening symptoms or concerns BP Readings from Last 3 Encounters:  06/06/16 128/78  07/11/13 104/72  07/04/13 112/86

## 2016-06-11 NOTE — Assessment & Plan Note (Addendum)
Mild uncontrolled, for PPI,  to f/u any worsening symptoms or concerns  In addition to the time spent performing CPE, I spent an additional 25 minutes face to face,in which greater than 50% of this time was spent in counseling and coordination of care for patient's acute illness as documented.

## 2016-06-11 NOTE — Assessment & Plan Note (Signed)
Lab Results  Component Value Date   LDLCALC 75 09/30/2013   stable overall by history and exam, recent data reviewed with pt, and pt to continue medical treatment as before,  to f/u any worsening symptoms or concerns

## 2016-06-11 NOTE — Assessment & Plan Note (Signed)

## 2016-06-11 NOTE — Assessment & Plan Note (Signed)
Ok for phentermine asd,  to f/u any worsening symptoms or concerns °

## 2016-06-13 ENCOUNTER — Telehealth: Payer: Self-pay | Admitting: Internal Medicine

## 2016-06-13 MED ORDER — PHENTERMINE HCL 37.5 MG PO TABS: 37.5000 mg | ORAL_TABLET | Freq: Every day | ORAL | 2 refills | Status: DC

## 2016-06-13 NOTE — Telephone Encounter (Signed)
Patient aware prescription is ready.

## 2016-06-13 NOTE — Telephone Encounter (Signed)
Pt called in and said that she would like the tab for phentermine called in to [facility] on [address]   She does not want the capsules

## 2016-06-13 NOTE — Telephone Encounter (Signed)
Done hardcopy to Corinne  

## 2016-10-05 DIAGNOSIS — M170 Bilateral primary osteoarthritis of knee: Secondary | ICD-10-CM | POA: Diagnosis not present

## 2016-10-05 DIAGNOSIS — M25561 Pain in right knee: Secondary | ICD-10-CM | POA: Diagnosis not present

## 2016-10-05 DIAGNOSIS — M25562 Pain in left knee: Secondary | ICD-10-CM | POA: Diagnosis not present

## 2016-10-09 ENCOUNTER — Other Ambulatory Visit: Payer: Self-pay | Admitting: Internal Medicine

## 2016-11-15 ENCOUNTER — Telehealth (INDEPENDENT_AMBULATORY_CARE_PROVIDER_SITE_OTHER): Payer: Self-pay | Admitting: Orthopedic Surgery

## 2016-11-15 NOTE — Telephone Encounter (Signed)
Patient request authorization to get the Synvisc injection

## 2016-11-20 NOTE — Telephone Encounter (Signed)
y

## 2016-11-20 NOTE — Telephone Encounter (Signed)
Ok to submit for pre auth for bilat knee injections? Last bilat knee injection of synvisc done on 03/08/16. Please advise.

## 2016-11-20 NOTE — Telephone Encounter (Signed)
I called patient. Advised would try to get auth for her and we would be in touch with her.

## 2016-11-21 ENCOUNTER — Other Ambulatory Visit: Payer: Self-pay

## 2016-11-21 ENCOUNTER — Telehealth (INDEPENDENT_AMBULATORY_CARE_PROVIDER_SITE_OTHER): Payer: Self-pay | Admitting: Orthopedic Surgery

## 2016-11-21 MED ORDER — OLMESARTAN MEDOXOMIL-HCTZ 40-12.5 MG PO TABS: 1.0000 | ORAL_TABLET | Freq: Every day | ORAL | 1 refills | Status: DC

## 2016-11-21 NOTE — Telephone Encounter (Signed)
Huntley DecSara from my scynvisc called to speak with you about the patient. CB # [phone]

## 2016-11-21 NOTE — Telephone Encounter (Signed)
I called her back. She was gone for the day s/w Don. She was going to take message for Huntley DecSara to call me back to discuss. When Huntley DecSara returns my call it is important that I speak with her rather than taking another message. Trying to resolve issue for patient regarding synvisc injection.

## 2016-11-22 ENCOUNTER — Telehealth: Payer: Self-pay | Admitting: Internal Medicine

## 2016-11-22 DIAGNOSIS — G4710 Hypersomnia, unspecified: Secondary | ICD-10-CM

## 2016-11-22 NOTE — Telephone Encounter (Signed)
[facility] - Client TELEPHONE ADVICE RECORD [facility] Patient Name: Ashley Wyatt DOB: 07/07/1975 Initial Comment She has pin and needles sensations in her feet and toes, hands, and arms. Nurse Assessment Nurse: Ladona RidgelGaddy, RN, Felicia Date/Time (Eastern Time): 11/22/2016 4:37:43 PM Confirm and document reason for call. If symptomatic, describe symptoms. ---PT has pins and needles feeling in her hands and feet onset 3 - 4 weeks ago. Does the patient have any new or worsening symptoms? ---Yes Will a triage be completed? ---Yes Related visit to physician within the last 2 weeks? ---No Does the PT have any chronic conditions? (i.e. diabetes, asthma, etc.) ---No Is the patient pregnant or possibly pregnant? (Ask all females between the ages of 6112-55) ---NoIs this a behavioral health or substance abuse call? ---No Guidelines Guideline Title Affirmed Question Affirmed Notes Neurologic Deficit Back pain (and neurologic deficit) Final Disposition User See Physician within 4 Hours (or PCP triage) Ladona RidgelGaddy, RN, Felicia Comments mild lower back pain that is off and on for a month or 2. No injury R temple hurts sometimes - a little bit today - for 6 months on and off HA level 2 at the highest today NO HA in last hour No HA now Told her if she has HA with the numbness in hands/ feet go to ER. Referrals GO TO FACILITY OTHER - SPECIFY Disagree/Comply: Comply Call Id: [id number]

## 2016-11-22 NOTE — Telephone Encounter (Signed)
Ok, referral done for pulm to eval for OSA

## 2016-11-22 NOTE — Telephone Encounter (Signed)
Patient states that she spoke to Dr. Jonny RuizJohn about a sleep study at last OV.  Patient would like Dr. Jonny RuizJohn to go ahead with the referral.

## 2016-11-28 ENCOUNTER — Ambulatory Visit (INDEPENDENT_AMBULATORY_CARE_PROVIDER_SITE_OTHER): Payer: 59 | Admitting: Internal Medicine

## 2016-11-28 ENCOUNTER — Other Ambulatory Visit (INDEPENDENT_AMBULATORY_CARE_PROVIDER_SITE_OTHER): Payer: 59

## 2016-11-28 ENCOUNTER — Encounter: Payer: Self-pay | Admitting: Internal Medicine

## 2016-11-28 VITALS — BP 126/86 | HR 80 | Ht 65.0 in | Wt 236.0 lb

## 2016-11-28 DIAGNOSIS — G4710 Hypersomnia, unspecified: Secondary | ICD-10-CM | POA: Diagnosis not present

## 2016-11-28 DIAGNOSIS — R7302 Impaired glucose tolerance (oral): Secondary | ICD-10-CM

## 2016-11-28 DIAGNOSIS — R202 Paresthesia of skin: Secondary | ICD-10-CM | POA: Diagnosis not present

## 2016-11-28 DIAGNOSIS — I10 Essential (primary) hypertension: Secondary | ICD-10-CM

## 2016-11-28 DIAGNOSIS — E559 Vitamin D deficiency, unspecified: Secondary | ICD-10-CM | POA: Diagnosis not present

## 2016-11-28 LAB — BASIC METABOLIC PANEL
BUN: 9 mg/dL (ref 6–23)
CALCIUM: 9.2 mg/dL (ref 8.4–10.5)
CO2: 29 mEq/L (ref 19–32)
Chloride: 102 mEq/L (ref 96–112)
Creatinine, Ser: 0.52 mg/dL (ref 0.40–1.20)
GFR: 137.17 mL/min (ref >60.00)
Glucose, Bld: 91 mg/dL (ref 70–99)
Potassium: 3.8 mEq/L (ref 3.5–5.1)
SODIUM: 138 meq/L (ref 135–145)

## 2016-11-28 LAB — VITAMIN B12: VITAMIN B 12: 731 pg/mL (ref 211–911)

## 2016-11-28 LAB — HEMOGLOBIN A1C: HEMOGLOBIN A1C: 6.2 % (ref 4.6–6.5)

## 2016-11-28 NOTE — Patient Instructions (Signed)
Please continue all other medications as before, and refills have been done if requested.  Please have the pharmacy call with any other refills you may need.  Please continue your efforts at being more active, low cholesterol diet, and weight control.  You are otherwise up to date with prevention measures today.  Please keep your appointments with your specialists as you may have planned  You will be contacted regarding the referral for: pulmonary  Please go to the LAB in the Basement (turn left off the elevator) for the tests to be done today  You will be contacted by phone if any changes need to be made immediately.  Otherwise, you will receive a letter about your results with an explanation, but please check with MyChart first.  Please remember to sign up for MyChart if you have not done so, as this will be important to you in the future with finding out test results, communicating by private email, and scheduling acute appointments online when needed.

## 2016-11-28 NOTE — Progress Notes (Signed)
Subjective:    Patient ID: Ashley Wyatt, female    DOB: 03/20/1976, 41 y.o.   MRN: [id number]  HPI  Here to f/u; overall doing ok,  Pt denies chest pain, increasing sob or doe, wheezing, orthopnea, PND, increased LE swelling, palpitations, dizziness or syncope.  Pt denies new neurological symptoms such as new headache, or facial or extremity weakness   Pt denies polydipsia, polyuria, or low sugar episode.  Pt states overall good compliance with meds, mostly trying to follow appropriate diet, with wt overall stable,  but little exercise however.  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  Now c/o 1 mo pins and needles feelings to distal arms and hands and feet, no pain, seems random, intermittent, mild , and sitting all day may make it worse,  Also, brother is Pulmonologist who has noted her increased hco3 over time and asks her to seek sleep apnea study Past Medical History:  Diagnosis Date  . ALLERGIC RHINITIS 08/20/2008   Qualifier: Diagnosis of  By: Everardo AllEllison MD, Cleophas DunkerSean A   . ANEMIA-IRON DEFICIENCY 11/01/2009   Qualifier: Diagnosis of  By: Jonny RuizJohn MD, Len BlalockJames W   . DIABETES MELLITUS, GESTATIONAL, HX OF 11/01/2009   Qualifier: Diagnosis of  By: Jonny RuizJohn MD, Len BlalockJames W   . ECZEMA 08/20/2008   Qualifier: Diagnosis of  By: Everardo AllEllison MD, Cleophas DunkerSean A   . HYPERCHOLESTEROLEMIA 08/20/2008   Qualifier: Diagnosis of  By: Everardo AllEllison MD, Cleophas DunkerSean A   . HYPERTENSION 02/11/2007   Qualifier: Diagnosis of  By: Everardo AllEllison MD, Cleophas DunkerSean A   . Hypertriglyceridemia 07/11/2013  . Impaired glucose tolerance 07/11/2013  . STRABISMUS 06/30/2010   Qualifier: Diagnosis of  By: Jonny RuizJohn MD, Len BlalockJames W   . VITAMIN D DEFICIENCY 11/01/2009   Qualifier: Diagnosis of  By: Jonny RuizJohn MD, Len BlalockJames W    Past Surgical History:  Procedure Laterality Date  . right knee surgury     arthroscopy  . TONSILLECTOMY      reports that she has never smoked. She has never used smokeless tobacco. She reports that she does  not drink alcohol or use drugs. family history is not on file. No Known Allergies Current Outpatient Prescriptions on File Prior to Visit  Medication Sig Dispense Refill  . olmesartan-hydrochlorothiazide (BENICAR HCT) 40-12.5 MG tablet Take 1 tablet by mouth daily. 90 tablet 1  . pantoprazole (PROTONIX) 20 MG tablet TAKE ONE TABLET BY MOUTH TWICE A DAY BEFORE MEALS 60 tablet 5   No current facility-administered medications on file prior to visit.    Review of Systems  Constitutional: Negative for other unusual diaphoresis or sweats HENT: Negative for ear discharge or swelling Eyes: Negative for other worsening visual disturbances Respiratory: Negative for stridor or other swelling  Gastrointestinal: Negative for worsening distension or other blood Genitourinary: Negative for retention or other urinary change Musculoskeletal: Negative for other MSK pain or swelling Skin: Negative for color change or other new lesions Neurological: Negative for worsening tremors and other numbness  Psychiatric/Behavioral: Negative for worsening agitation or other fatigue All other system neg per pt    Objective:   Physical Exam BP 126/86   Pulse 80   Ht 5\' 5"  (1.651 m)   Wt 236 lb (107 kg)   SpO2 99%   BMI 39.27 kg/m  VS noted,  Constitutional: Pt appears in NAD HENT: Head: NCAT.  Right Ear: External ear normal.  Left Ear: External ear normal.  Eyes: .  Pupils are equal, round, and reactive to light. Conjunctivae and EOM are normal Nose: without d/c or deformity Neck: Neck supple. Gross normal ROM Cardiovascular: Normal rate and regular rhythm.   Pulmonary/Chest: Effort normal and breath sounds without rales or wheezing.  Abd:  Soft, NT, ND, + BS, no organomegaly Neurological: Pt is alert. At baseline orientation, motor grossly intact Skin: Skin is warm. No rashes, other new lesions, no LE edema Psychiatric: Pt behavior is normal without agitation  No other exam findings    Assessment &  Plan:

## 2016-12-01 DIAGNOSIS — G4710 Hypersomnia, unspecified: Secondary | ICD-10-CM | POA: Insufficient documentation

## 2016-12-01 NOTE — Assessment & Plan Note (Signed)
Mild intermittent, etiology unclear, for bilat wrist splints at night trial, as as check vit b12 level, consider NCS

## 2016-12-01 NOTE — Assessment & Plan Note (Signed)
stable overall by history and exam, recent data reviewed with pt, and pt to continue medical treatment as before,  to f/u any worsening symptoms or concerns BP Readings from Last 3 Encounters:  11/28/16 126/86  06/06/16 128/78  07/11/13 104/72

## 2016-12-01 NOTE — Assessment & Plan Note (Signed)
Severe low , for vit d 50k weekly for 12 wks, then 2000 units after dialy

## 2016-12-01 NOTE — Assessment & Plan Note (Signed)
stable overall by history and exam, recent data reviewed with pt, and pt to continue medical treatment as before,  to f/u any worsening symptoms or concerns Lab Results  Component Value Date   HGBA1C 6.2 11/28/2016

## 2016-12-01 NOTE — Assessment & Plan Note (Signed)
For pulm evaluation, r/o OSA

## 2016-12-13 ENCOUNTER — Encounter: Payer: Self-pay | Admitting: Internal Medicine

## 2016-12-13 ENCOUNTER — Encounter (INDEPENDENT_AMBULATORY_CARE_PROVIDER_SITE_OTHER): Payer: Self-pay | Admitting: Orthopedic Surgery

## 2016-12-13 ENCOUNTER — Ambulatory Visit (INDEPENDENT_AMBULATORY_CARE_PROVIDER_SITE_OTHER): Payer: 59 | Admitting: Orthopedic Surgery

## 2016-12-13 DIAGNOSIS — M25561 Pain in right knee: Principal | ICD-10-CM

## 2016-12-13 DIAGNOSIS — G8929 Other chronic pain: Secondary | ICD-10-CM

## 2016-12-13 DIAGNOSIS — M25562 Pain in left knee: Principal | ICD-10-CM

## 2016-12-14 ENCOUNTER — Telehealth: Payer: Self-pay | Admitting: *Deleted

## 2016-12-14 DIAGNOSIS — G8929 Other chronic pain: Secondary | ICD-10-CM | POA: Diagnosis not present

## 2016-12-14 DIAGNOSIS — M1712 Unilateral primary osteoarthritis, left knee: Secondary | ICD-10-CM | POA: Diagnosis not present

## 2016-12-14 DIAGNOSIS — E559 Vitamin D deficiency, unspecified: Secondary | ICD-10-CM

## 2016-12-14 DIAGNOSIS — M25562 Pain in left knee: Secondary | ICD-10-CM

## 2016-12-14 DIAGNOSIS — M25561 Pain in right knee: Secondary | ICD-10-CM | POA: Diagnosis not present

## 2016-12-14 DIAGNOSIS — M1711 Unilateral primary osteoarthritis, right knee: Secondary | ICD-10-CM

## 2016-12-14 MED ORDER — HYLAN G-F 20 48 MG/6ML IX SOSY
48.0000 mg | PREFILLED_SYRINGE | INTRA_ARTICULAR | Status: AC | PRN
  Administered 2016-12-14: 48 mg via INTRA_ARTICULAR

## 2016-12-14 MED ORDER — LIDOCAINE HCL 1 % IJ SOLN
5.0000 mL | INTRAMUSCULAR | Status: AC | PRN
  Administered 2016-12-14: 5 mL

## 2016-12-14 MED ORDER — BUPIVACAINE HCL 0.25 % IJ SOLN
4.0000 mL | INTRAMUSCULAR | Status: AC | PRN
  Administered 2016-12-14: 4 mL via INTRA_ARTICULAR

## 2016-12-14 NOTE — Progress Notes (Signed)
   Procedure Note  Patient: Ashley Wyatt             Date of Birth: 08/30/1975           MRN: [id number]             Visit Date: 12/13/2016  Procedures: Visit Diagnoses: Chronic pain of both knees  Large Joint Inj Date/Time: 12/14/2016 1:06 PM Performed by: Cammy CopaEAN, SCOTT [name] Authorized by: Cammy CopaEAN, SCOTT [name]   Consent Given by:  Patient Site marked: the procedure site was marked   Timeout: prior to procedure the correct patient, procedure, and site was verified   Indications:  Pain, joint swelling and diagnostic evaluation Location:  Knee Site:  R knee Prep: patient was prepped and draped in usual sterile fashion   Needle Size:  18 G Needle Length:  1.5 inches Approach:  Superolateral Ultrasound Guidance: No   Fluoroscopic Guidance: No   Arthrogram: No   Medications:  5 mL lidocaine 1 %; 48 mg Hylan 48 MG/6ML Aspiration Attempted: Yes   Aspirate amount (mL):  30 Aspirate:  Yellow Patient tolerance:  Patient tolerated the procedure well with no immediate complications Large Joint Inj Date/Time: 12/14/2016 1:07 PM Performed by: Cammy CopaEAN, SCOTT [name] Authorized by: Cammy CopaEAN, SCOTT [name]   Consent Given by:  Patient Site marked: the procedure site was marked   Timeout: prior to procedure the correct patient, procedure, and site was verified   Indications:  Pain, joint swelling and diagnostic evaluation Location:  Knee Site:  L knee Prep: patient was prepped and draped in usual sterile fashion   Needle Size:  18 G Needle Length:  1.5 inches Approach:  Superolateral Ultrasound Guidance: No   Fluoroscopic Guidance: No   Arthrogram: No   Medications:  5 mL lidocaine 1 %; 4 mL bupivacaine 0.25 %; 48 mg Hylan 48 MG/6ML Aspiration Attempted: Yes   Aspirate amount (mL):  30 Aspirate:  Yellow Patient tolerance:  Patient tolerated the procedure well with no immediate complications

## 2016-12-14 NOTE — Telephone Encounter (Signed)
Pharmacist left msg on triage pt requesting refill on her Vitamin D 50,000 to take once a week. Med is no loger on med list pls advise...Raechel Chute/lmb

## 2016-12-14 NOTE — Telephone Encounter (Signed)
I would not feel comfortable with such as large dose Vit D refills; it is usually rx like this only for 3 mo  OK to take the Vit D 2000 units OTC daily instead as should no longer need the high dose

## 2016-12-15 NOTE — Telephone Encounter (Signed)
Notified pt w/MD response. Pt states her Vit D is consistently low bck in Dec MD gave rx but did not take. She is wanting to see if she can have her vit D check since it was not check on 6/12, and if results are still low she would like rx. Pls advise...Raechel Chute/LMB

## 2016-12-15 NOTE — Telephone Encounter (Signed)
Notified pt w/MD response.../lmb 

## 2016-12-15 NOTE — Telephone Encounter (Signed)
Ok , lab for Vit D f/u has been ordered  It should be improved, but if it is only slightly low, I still would not rx the high dose Vit D , as this is not meant for long term treatment

## 2017-02-01 ENCOUNTER — Telehealth: Payer: Self-pay | Admitting: Internal Medicine

## 2017-02-01 NOTE — Telephone Encounter (Signed)
Error

## 2017-03-22 ENCOUNTER — Institutional Professional Consult (permissible substitution): Payer: Managed Care, Other (non HMO) | Admitting: Pulmonary Disease

## 2017-03-27 ENCOUNTER — Institutional Professional Consult (permissible substitution): Payer: Managed Care, Other (non HMO) | Admitting: Pulmonary Disease

## 2017-03-27 DIAGNOSIS — Z23 Encounter for immunization: Secondary | ICD-10-CM | POA: Diagnosis not present

## 2017-05-23 ENCOUNTER — Other Ambulatory Visit: Payer: Self-pay | Admitting: Internal Medicine

## 2017-06-08 ENCOUNTER — Telehealth (INDEPENDENT_AMBULATORY_CARE_PROVIDER_SITE_OTHER): Payer: Self-pay | Admitting: Orthopedic Surgery

## 2017-06-08 ENCOUNTER — Telehealth (INDEPENDENT_AMBULATORY_CARE_PROVIDER_SITE_OTHER): Payer: Self-pay

## 2017-06-08 NOTE — Telephone Encounter (Signed)
Addressed for patient. R/S to 12/27 with Fayrene FearingJames

## 2017-06-08 NOTE — Telephone Encounter (Signed)
Patient said she just received a call about changing her appt for injections, she needs the appt this year due to insurance deductible. She asks that you give her a call back ASAP #[phone]

## 2017-06-08 NOTE — Telephone Encounter (Signed)
I just saw this - can I r/s with Lajoyce CornersDuda? I sent another message to you because she called back.

## 2017-06-08 NOTE — Telephone Encounter (Signed)
Upon noticing that the patient had an appt scheduled for 06/13/17 I called patient and LMVM for her advising she would not be able to come in on that day to get repeat gel injections. Her appt must be r/s to 12/27 or after for repeat gel injections. It must be 6 months exactly to the date or after in order for insurance to cover gel injections. If she calls back PLEASE reschedule this for after 12/27. If she wants done this year and another provider has availability ok to schedule with them if ok with patient. If not will have to resubmit to her insurance after Jan 1 for benefit verification and call her once we know something.

## 2017-06-13 ENCOUNTER — Ambulatory Visit (INDEPENDENT_AMBULATORY_CARE_PROVIDER_SITE_OTHER): Payer: 59 | Admitting: Orthopedic Surgery

## 2017-06-14 ENCOUNTER — Encounter (INDEPENDENT_AMBULATORY_CARE_PROVIDER_SITE_OTHER): Payer: Self-pay | Admitting: Surgery

## 2017-06-14 ENCOUNTER — Ambulatory Visit (INDEPENDENT_AMBULATORY_CARE_PROVIDER_SITE_OTHER): Payer: 59 | Admitting: Surgery

## 2017-06-14 VITALS — BP 128/87 | HR 77 | Ht 65.0 in | Wt 230.0 lb

## 2017-06-14 DIAGNOSIS — M1712 Unilateral primary osteoarthritis, left knee: Secondary | ICD-10-CM | POA: Diagnosis not present

## 2017-06-14 DIAGNOSIS — M1711 Unilateral primary osteoarthritis, right knee: Secondary | ICD-10-CM

## 2017-06-14 MED ORDER — HYLAN G-F 20 48 MG/6ML IX SOSY
48.0000 mg | PREFILLED_SYRINGE | INTRA_ARTICULAR | Status: AC | PRN
  Administered 2017-06-14: 48 mg via INTRA_ARTICULAR

## 2017-06-14 MED ORDER — LIDOCAINE HCL 1 % IJ SOLN
3.0000 mL | INTRAMUSCULAR | Status: AC | PRN
  Administered 2017-06-14: 3 mL

## 2017-06-14 NOTE — Progress Notes (Signed)
Office Visit Note   Patient: Ashley Wyatt           Date of Birth: 04/07/1976           MRN: [id number] Visit Date: 06/14/2017              Requested by: Corwin LevinsJohn, Aldora Perman W, MD [address] [place] [id number] PCP: Corwin LevinsJohn, Dalis Beers W, MD   Assessment & Plan: Visit Diagnoses:  1. Arthritis of left knee   2. Arthritis of right knee     Plan: The patient consent bilateral knees were prepped with Betadine and after using Xylocaine for local anesthetic bilateral knee Synvisc one injections were performed. follow-up with Dr. August Saucerean in 3 months for recheck. Returns sooner if needed.  Follow-Up Instructions: Return in about 3 months (around 09/12/2017) for With Dr. August Saucerean.   Orders:  No orders of the defined types were placed in this encounter.  No orders of the defined types were placed in this encounter.     Procedures: Large Joint Inj: R knee on 06/14/2017 10:55 AM Indications: pain Details: 22 G 1.5 in needle, anteromedial approach Medications: 3 mL lidocaine 1 %; 48 mg Hylan 48 MG/6ML Aspirate: serous Consent was given by the patient. Patient was prepped and draped in the usual sterile fashion.   Large Joint Inj: L knee on 06/14/2017 10:56 AM Indications: pain Details: 22 G 1.5 in needle, anteromedial approach Medications: 3 mL lidocaine 1 %; 48 mg Hylan 48 MG/6ML Aspirate: serous and blood-tinged Outcome: tolerated well, no immediate complications Consent was given by the patient.       Clinical Data: No additional findings.   Subjective: Chief Complaint  Patient presents with  . Left Knee - Pain    Bilateral Synvisc One Injections-Buy and Bill  . Right Knee - Pain    HPI Patient comes in today for bilateral knee Synvisc one injections. Established patient of Dr. August Saucerean. Continues of ongoing pain in bilateral knees. Review of Systems No current cardiopulmonary GI GU issues  Objective: Vital Signs: BP 128/87   Pulse 77   Ht 5\' 5"  (1.651 m)   Wt 230 lb  (104.3 kg)   BMI 38.27 kg/m   Physical Exam  Constitutional: She is oriented to person, place, and time. She appears well-developed. No distress.  HENT:  Head: Normocephalic and atraumatic.  Eyes: Pupils are equal, round, and reactive to light.  Musculoskeletal:  Bodies good range of motion. Joint line is tender.  Neurological: She is alert and oriented to person, place, and time.  Skin: Skin is warm and dry.    Ortho Exam  Specialty Comments:  No specialty comments available.  Imaging: No results found.   PMFS History: Patient Active Problem List   Diagnosis Date Noted  . Hypersomnolence 12/01/2016  . Paresthesias 11/28/2016  . GERD (gastroesophageal reflux disease) 06/06/2016  . Impaired glucose tolerance 07/11/2013  . Hypertriglyceridemia 07/11/2013  . Obesity 07/11/2013  . Polyarthralgia 07/04/2013  . Encounter for well adult exam with abnormal findings 11/18/2011  . STRABISMUS 06/30/2010  . Vitamin D deficiency 11/01/2009  . ANEMIA-IRON DEFICIENCY 11/01/2009  . URTICARIA 11/01/2009  . DIABETES MELLITUS, GESTATIONAL, HX OF 11/01/2009  . HYPERCHOLESTEROLEMIA 08/20/2008  . ALLERGIC RHINITIS 08/20/2008  . ECZEMA 08/20/2008  . FATIGUE 08/20/2008  . Essential hypertension 02/11/2007   Past Medical History:  Diagnosis Date  . ALLERGIC RHINITIS 08/20/2008   Qualifier: Diagnosis of  By: Everardo AllEllison MD, Cleophas DunkerSean A   . ANEMIA-IRON DEFICIENCY  11/01/2009   Qualifier: Diagnosis of  By: Jonny RuizJohn MD, Len BlalockJames W   . DIABETES MELLITUS, GESTATIONAL, HX OF 11/01/2009   Qualifier: Diagnosis of  By: Jonny RuizJohn MD, Len BlalockJames W   . ECZEMA 08/20/2008   Qualifier: Diagnosis of  By: Everardo AllEllison MD, Cleophas DunkerSean A   . HYPERCHOLESTEROLEMIA 08/20/2008   Qualifier: Diagnosis of  By: Everardo AllEllison MD, Cleophas DunkerSean A   . HYPERTENSION 02/11/2007   Qualifier: Diagnosis of  By: Everardo AllEllison MD, Cleophas DunkerSean A   . Hypertriglyceridemia 07/11/2013  . Impaired glucose tolerance 07/11/2013  . STRABISMUS 06/30/2010   Qualifier: Diagnosis of  By: Jonny RuizJohn MD, Len BlalockJames W     . VITAMIN D DEFICIENCY 11/01/2009   Qualifier: Diagnosis of  By: Jonny RuizJohn MD, Len BlalockJames W     Family History  Problem Relation Age of Onset  . Hypertension Unknown   . Breast cancer Unknown     Past Surgical History:  Procedure Laterality Date  . right knee surgury     arthroscopy  . TONSILLECTOMY     Social History   Occupational History  . Not on file  Tobacco Use  . Smoking status: Never Smoker  . Smokeless tobacco: Never Used  Substance and Sexual Activity  . Alcohol use: No  . Drug use: No  . Sexual activity: Not on file

## 2017-06-22 ENCOUNTER — Other Ambulatory Visit: Payer: Self-pay | Admitting: Internal Medicine

## 2017-07-05 DIAGNOSIS — Z23 Encounter for immunization: Secondary | ICD-10-CM | POA: Diagnosis not present

## 2017-07-22 ENCOUNTER — Other Ambulatory Visit: Payer: Self-pay | Admitting: Internal Medicine

## 2017-07-23 ENCOUNTER — Institutional Professional Consult (permissible substitution): Payer: Managed Care, Other (non HMO) | Admitting: Internal Medicine

## 2017-07-28 ENCOUNTER — Other Ambulatory Visit: Payer: Self-pay | Admitting: Internal Medicine

## 2017-07-30 ENCOUNTER — Other Ambulatory Visit: Payer: Self-pay

## 2017-07-30 MED ORDER — OLMESARTAN MEDOXOMIL-HCTZ 40-12.5 MG PO TABS: 1.0000 | ORAL_TABLET | Freq: Every day | ORAL | 0 refills | Status: DC

## 2017-08-03 ENCOUNTER — Ambulatory Visit (INDEPENDENT_AMBULATORY_CARE_PROVIDER_SITE_OTHER): Payer: 59 | Admitting: Internal Medicine

## 2017-08-03 ENCOUNTER — Encounter: Payer: Self-pay | Admitting: Internal Medicine

## 2017-08-03 ENCOUNTER — Other Ambulatory Visit (INDEPENDENT_AMBULATORY_CARE_PROVIDER_SITE_OTHER): Payer: 59

## 2017-08-03 VITALS — BP 124/78 | HR 90 | Temp 98.4°F | Ht 65.0 in | Wt 226.0 lb

## 2017-08-03 DIAGNOSIS — L709 Acne, unspecified: Secondary | ICD-10-CM

## 2017-08-03 DIAGNOSIS — D509 Iron deficiency anemia, unspecified: Secondary | ICD-10-CM

## 2017-08-03 DIAGNOSIS — E559 Vitamin D deficiency, unspecified: Secondary | ICD-10-CM

## 2017-08-03 DIAGNOSIS — M6740 Ganglion, unspecified site: Secondary | ICD-10-CM | POA: Diagnosis not present

## 2017-08-03 DIAGNOSIS — R7302 Impaired glucose tolerance (oral): Secondary | ICD-10-CM

## 2017-08-03 DIAGNOSIS — Z0001 Encounter for general adult medical examination with abnormal findings: Secondary | ICD-10-CM

## 2017-08-03 DIAGNOSIS — Z114 Encounter for screening for human immunodeficiency virus [HIV]: Secondary | ICD-10-CM | POA: Diagnosis not present

## 2017-08-03 DIAGNOSIS — R21 Rash and other nonspecific skin eruption: Secondary | ICD-10-CM

## 2017-08-03 DIAGNOSIS — H938X1 Other specified disorders of right ear: Secondary | ICD-10-CM

## 2017-08-03 DIAGNOSIS — I10 Essential (primary) hypertension: Secondary | ICD-10-CM | POA: Diagnosis not present

## 2017-08-03 LAB — HEMOGLOBIN A1C: Hgb A1c MFr Bld: 5.9 % (ref 4.6–6.5)

## 2017-08-03 LAB — URINALYSIS, ROUTINE W REFLEX MICROSCOPIC
Bilirubin Urine: NEGATIVE
HGB URINE DIPSTICK: NEGATIVE
KETONES UR: NEGATIVE
LEUKOCYTES UA: NEGATIVE
Nitrite: NEGATIVE
SPECIFIC GRAVITY, URINE: 1.015 (ref 1.000–1.030)
TOTAL PROTEIN, URINE-UPE24: NEGATIVE
URINE GLUCOSE: NEGATIVE
UROBILINOGEN UA: 0.2 (ref 0.0–1.0)
pH: 7 (ref 5.0–8.0)

## 2017-08-03 LAB — CBC WITH DIFFERENTIAL/PLATELET
BASOS PCT: 0.2 % (ref 0.0–3.0)
Basophils Absolute: 0 10*3/uL (ref 0.0–0.1)
EOS PCT: 4.4 % (ref 0.0–5.0)
Eosinophils Absolute: 0.4 10*3/uL (ref 0.0–0.7)
HEMATOCRIT: 36.9 % (ref 36.0–46.0)
Hemoglobin: 12.2 g/dL (ref 12.0–15.0)
Lymphocytes Relative: 27.1 % (ref 12.0–46.0)
Lymphs Abs: 2.4 10*3/uL (ref 0.7–4.0)
MCHC: 33 g/dL (ref 30.0–36.0)
MCV: 77.6 fl — AB (ref 78.0–100.0)
MONOS PCT: 5.4 % (ref 3.0–12.0)
Monocytes Absolute: 0.5 10*3/uL (ref 0.1–1.0)
NEUTROS ABS: 5.5 10*3/uL (ref 1.4–7.7)
Neutrophils Relative %: 62.9 % (ref 43.0–77.0)
PLATELETS: 254 10*3/uL (ref 150.0–400.0)
RBC: 4.76 Mil/uL (ref 3.87–5.11)
RDW: 16.3 % — AB (ref 11.5–15.5)
WBC: 8.8 10*3/uL (ref 4.0–10.5)

## 2017-08-03 LAB — HEPATIC FUNCTION PANEL
ALBUMIN: 4.2 g/dL (ref 3.5–5.2)
ALK PHOS: 81 U/L (ref 39–117)
ALT: 14 U/L (ref 0–35)
AST: 15 U/L (ref 0–37)
Bilirubin, Direct: 0.1 mg/dL (ref 0.0–0.3)
TOTAL PROTEIN: 7 g/dL (ref 6.0–8.3)
Total Bilirubin: 0.5 mg/dL (ref 0.2–1.2)

## 2017-08-03 LAB — IBC PANEL
Iron: 94 ug/dL (ref 42–145)
Saturation Ratios: 18 % — ABNORMAL LOW (ref 20.0–50.0)
Transferrin: 374 mg/dL — ABNORMAL HIGH (ref 212.0–360.0)

## 2017-08-03 LAB — LIPID PANEL
CHOL/HDL RATIO: 5
CHOLESTEROL: 155 mg/dL (ref 0–200)
HDL: 30.1 mg/dL — ABNORMAL LOW (ref >39.00)
NonHDL: 125.33
TRIGLYCERIDES: 290 mg/dL — AB (ref 0.0–149.0)
VLDL: 58 mg/dL — ABNORMAL HIGH (ref 0.0–40.0)

## 2017-08-03 LAB — BASIC METABOLIC PANEL
BUN: 11 mg/dL (ref 6–23)
CHLORIDE: 99 meq/L (ref 96–112)
CO2: 30 mEq/L (ref 19–32)
CREATININE: 0.55 mg/dL (ref 0.40–1.20)
Calcium: 8.8 mg/dL (ref 8.4–10.5)
GFR: 128.16 mL/min (ref >60.00)
Glucose, Bld: 100 mg/dL — ABNORMAL HIGH (ref 70–99)
POTASSIUM: 3.7 meq/L (ref 3.5–5.1)
Sodium: 138 mEq/L (ref 135–145)

## 2017-08-03 LAB — VITAMIN D 25 HYDROXY (VIT D DEFICIENCY, FRACTURES): VITD: 26.32 ng/mL — ABNORMAL LOW (ref 30.00–100.00)

## 2017-08-03 LAB — TSH: TSH: 1.66 u[IU]/mL (ref 0.35–4.50)

## 2017-08-03 LAB — LDL CHOLESTEROL, DIRECT: Direct LDL: 81 mg/dL

## 2017-08-03 MED ORDER — OLMESARTAN MEDOXOMIL 40 MG PO TABS: 40.0000 mg | ORAL_TABLET | Freq: Every day | ORAL | 3 refills | Status: DC

## 2017-08-03 MED ORDER — TRETINOIN 0.025 % EX CREA: TOPICAL_CREAM | Freq: Every day | CUTANEOUS | 1 refills | Status: DC

## 2017-08-03 MED ORDER — HYDROCHLOROTHIAZIDE 25 MG PO TABS: 12.5000 mg | ORAL_TABLET | Freq: Every day | ORAL | 3 refills | Status: DC

## 2017-08-03 MED ORDER — CLOTRIMAZOLE-BETAMETHASONE 1-0.05 % EX CREA: TOPICAL_CREAM | CUTANEOUS | 1 refills | Status: DC

## 2017-08-03 NOTE — Progress Notes (Signed)
Subjective:    Patient ID: Ashley Wyatt, female    DOB: Mar 14, 1976, 42 y.o.   MRN: [id number]  HPI  Here for wellness and f/u;  Overall doing ok;  Pt denies Chest pain, worsening SOB, DOE, wheezing, orthopnea, PND, worsening LE edema, palpitations, dizziness or syncope.  Pt denies neurological change such as new headache, facial or extremity weakness. Pt states overall good compliance with treatment and medications, good tolerability, and has been trying to follow appropriate diet.  Pt denies worsening depressive symptoms, suicidal ideation or panic. No fever, night sweats, wt loss, loss of appetite, or other constitutional symptoms.  Pt states good ability with ADL's, has low fall risk, home safety reviewed and adequate, no other significant changes in hearing or vision, and not active with exercise. Has many other concerns, including f/u hyperglycemia -  Pt denies polydipsia, polyuria.  Has a nontender small mass to post mid pinna, bigger then smaller at times, nothing seems to make better or worse.  Also has a very small tender firm swelling area to left post hand along the 4th ray near the wrist, works as a Engineer, drilling mostly with hands, activity may make worse, nothing seems to make better. Also with recurring acne to face improved with Retin A in past, not pregnant, asking for restart.  Also tends to get fungal irritation rash small areas near breasts and groin, asks for lotrisone which has helped in past.  No OSA symptoms  Has lost wt overall with lower carb low sugar diet., more active but could do better per pt.  Wt Readings from Last 3 Encounters:  08/03/17 226 lb (102.5 kg)  06/14/17 230 lb (104.3 kg)  11/28/16 236 lb (107 kg)  Pt plans to call on her own for optho yearly appt. , as well as GYN.  Has appt with [facility] in march for possible osa., no longer needs to take the protonix since she takes apple cider vinegar that seems to control reflus symptoms, No other interval hx or new  complaints Past Medical History:  Diagnosis Date  . ALLERGIC RHINITIS 08/20/2008   Qualifier: Diagnosis of  By: Everardo All MD, Cleophas Dunker   . ANEMIA-IRON DEFICIENCY 11/01/2009   Qualifier: Diagnosis of  By: [name] MD, Len Blalock   . DIABETES MELLITUS, GESTATIONAL, HX OF 11/01/2009   Qualifier: Diagnosis of  By: [name] MD, Len Blalock   . ECZEMA 08/20/2008   Qualifier: Diagnosis of  By: Everardo All MD, Cleophas Dunker HYPERCHOLESTEROLEMIA 08/20/2008   Qualifier: Diagnosis of  By: Everardo All MD, Cleophas Dunker   . HYPERTENSION 02/11/2007   Qualifier: Diagnosis of  By: Everardo All MD, Cleophas Dunker   . Hypertriglyceridemia 07/11/2013  . Impaired glucose tolerance 07/11/2013  . STRABISMUS 06/30/2010   Qualifier: Diagnosis of  By: [name] MD, Len Blalock   . VITAMIN D DEFICIENCY 11/01/2009   Qualifier: Diagnosis of  By: [name] MD, Len Blalock    Past Surgical History:  Procedure Laterality Date  . right knee surgury     arthroscopy  . TONSILLECTOMY      reports that  has never smoked. she has never used smokeless tobacco. She reports that she does not drink alcohol or use drugs. family history includes Breast cancer in her unknown relative; Hypertension in her unknown relative. No Known Allergies No current outpatient medications on file prior to visit.   No current facility-administered medications on file prior to visit.    Review of Systems Constitutional: Negative for other unusual  diaphoresis, sweats, appetite or weight changes HENT: Negative for other worsening hearing loss, ear pain, facial swelling, mouth sores or neck stiffness.   Eyes: Negative for other worsening pain, redness or other visual disturbance.  Respiratory: Negative for other stridor or swelling Cardiovascular: Negative for other palpitations or other chest pain  Gastrointestinal: Negative for worsening diarrhea or loose stools, blood in stool, distention or other pain Genitourinary: Negative for hematuria, flank pain or other change in urine volume.  Musculoskeletal: Negative  for myalgias or other joint swelling.  Skin: Negative for other color change, or other wound or worsening drainage.  Neurological: Negative for other syncope or numbness. Hematological: Negative for other adenopathy or swelling Psychiatric/Behavioral: Negative for hallucinations, other worsening agitation, SI, self-injury, or new decreased concentration All other system neg per pt    Objective:   Physical Exam BP 124/78   Pulse 90   Temp 98.4 F (36.9 C) (Oral)   Ht 5\' 5"  (1.651 m)   Wt 226 lb (102.5 kg)   SpO2 96%   BMI 37.61 kg/m  VS noted,  Constitutional: Pt is oriented to person, place, and time. Appears well-developed and well-nourished, in no significant distress and comfortable Head: Normocephalic and atraumatic  Eyes: Conjunctivae and EOM are normal. Pupils are equal, round, and reactive to light Right Ear: External ear normal without discharge; right mid post ear with 1/2 cm raised cystic type mass nontender without d/c Left Ear: External ear normal without discharge Nose: Nose without discharge or deformity Mouth/Throat: Oropharynx is without other ulcerations and moist  Neck: Normal range of motion. Neck supple. No JVD present. No tracheal deviation present or significant neck LA or mass Cardiovascular: Normal rate, regular rhythm, normal heart sounds and intact distal pulses.   Pulmonary/Chest: WOB normal and breath sounds without rales or wheezing  Abdominal: Soft. Bowel sounds are normal. NT. No HSM  Musculoskeletal: Normal range of motion. Exhibits no edema; left post hand near wrist with small ganglion cystic mass to 4th ray, mild tender Lymphadenopathy: Has no other cervical adenopathy.  Neurological: Pt is alert and oriented to person, place, and time. Pt has normal reflexes. No cranial nerve deficit. Motor grossly intact, Gait intact Skin: Skin is warm and dry. No rash noted or new ulcerations; does have small facial acneform changes Psychiatric:  Has normal mood  and affect. Behavior is normal without agitation No other exam findings    Assessment & Plan:

## 2017-08-03 NOTE — Patient Instructions (Signed)
Ok to take the benicar 40 mg and fluid pill separately at your request  Please take all new medication as prescribed - the lotrisone and retin-A  Please continue all other medications as before, and refills have been done if requested.  Please have the pharmacy call with any other refills you may need.  Please continue your efforts at being more active, low cholesterol diet, and weight control.  You are otherwise up to date with prevention measures today.  Please keep your appointments with your specialists as you may have planned  Please go to the LAB in the Basement (turn left off the elevator) for the tests to be done today  You will be contacted by phone if any changes need to be made immediately.  Otherwise, you will receive a letter about your results with an explanation, but please check with MyChart first.  Please remember to sign up for MyChart if you have not done so, as this will be important to you in the future with finding out test results, communicating by private email, and scheduling acute appointments online when needed.  Please return in 1 year for your yearly visit, or sooner if needed, with Lab testing done 3-5 days before

## 2017-08-04 DIAGNOSIS — M6740 Ganglion, unspecified site: Secondary | ICD-10-CM | POA: Insufficient documentation

## 2017-08-04 DIAGNOSIS — H938X1 Other specified disorders of right ear: Secondary | ICD-10-CM | POA: Insufficient documentation

## 2017-08-04 LAB — HIV ANTIBODY (ROUTINE TESTING W REFLEX): HIV: NONREACTIVE

## 2017-08-04 NOTE — Assessment & Plan Note (Signed)
Asympt,  Lab Results  Component Value Date   HGBA1C 5.9 08/03/2017  stable overall by history and exam, recent data reviewed with pt, and pt to continue medical treatment as before,  to f/u any worsening symptoms or concerns

## 2017-08-04 NOTE — Assessment & Plan Note (Signed)
Left post hand, very small with mild tender only, d/w pt natural hx. Ok to follow for worsening, consider hand surgury if worsening

## 2017-08-04 NOTE — Assessment & Plan Note (Signed)
Also for iron level f/u,  to f/u any worsening symptoms or concerns  

## 2017-08-04 NOTE — Assessment & Plan Note (Signed)
Mild to mod, for retin A restart asd,  to f/u any worsening symptoms or concerns

## 2017-08-04 NOTE — Assessment & Plan Note (Signed)
To cont vit d oral supplement, for lab level f/u

## 2017-08-04 NOTE — Assessment & Plan Note (Signed)

## 2017-08-04 NOTE — Assessment & Plan Note (Signed)
stable overall by history and exam, recent data reviewed with pt, and pt to continue medical treatment as before,  to f/u any worsening symptoms or concerns BP Readings from Last 3 Encounters:  08/03/17 124/78  06/14/17 128/87  11/28/16 126/86

## 2017-08-04 NOTE — Assessment & Plan Note (Signed)
Very small , minor tender, c/w cystic lesion, d/w pt , reassured, consider ENT if worsening

## 2017-08-04 NOTE — Assessment & Plan Note (Addendum)
Minimally active now but likely fungal in nature, ok for lotrisone asd,  to f/u any worsening symptoms or concerns  In addition to the time spent performing CPE, I spent an additional 25 minutes face to face,in which greater than 50% of this time was spent in counseling and coordination of care for patient's acute illness as documented, including the differential dx, treatment, further evaluation and other management of rash, ganglion cyst, acne, right post ear mass, Vit D deficiency, and hyperglycemia, HTN

## 2017-08-05 ENCOUNTER — Encounter: Payer: Self-pay | Admitting: Internal Medicine

## 2017-08-22 ENCOUNTER — Encounter: Payer: Self-pay | Admitting: Pulmonary Disease

## 2017-08-22 ENCOUNTER — Ambulatory Visit (INDEPENDENT_AMBULATORY_CARE_PROVIDER_SITE_OTHER): Payer: 59 | Admitting: Pulmonary Disease

## 2017-08-22 DIAGNOSIS — G4710 Hypersomnia, unspecified: Secondary | ICD-10-CM | POA: Diagnosis not present

## 2017-08-22 NOTE — Patient Instructions (Signed)
Home sleep study will be scheduled We discussed treatment options 

## 2017-08-22 NOTE — Assessment & Plan Note (Signed)
Given excessive daytime somnolence, narrow pharyngeal exam & loud snoring, obstructive sleep apnea is very likely & an overnight polysomnogram will be scheduled as a home study. The pathophysiology of obstructive sleep apnea , it's cardiovascular consequences & modes of treatment including CPAP were discused with the patient in detail & they evidenced understanding.  Pretest probability is intermediate  

## 2017-08-22 NOTE — Assessment & Plan Note (Signed)
Weight loss encouraged  and causative relationship between OSA and weight gain was discussed

## 2017-08-22 NOTE — Progress Notes (Signed)
Subjective:    Patient ID: Ashley Wyatt, female    DOB: 10/06/1975, 42 y.o.   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 Her husband has noted loud snoring and has reported this.  Her brother is a pulmonary critical care physician and is convinced that she has obstructive sleep apnea. She reports weight gain of about 60 pounds since she had her twins 7 years ago to her current weight of 229 pounds.  She also has a 43 year old daughter. She works at [employer] about 12 hours shifts about 3 days a week. Epworth sleepiness score is 9 and she reports sleepiness in various situations such as lying down to rest in the afternoons, or as a passenger in a car or sitting quietly after lunch without alcohol.  Bedtime is between 11 PM and midnight, sleep latency is about 20 minutes, she sleeps on her side with 1-2 pillows, reports 1-3 nocturnal awakenings including nocturia and is out of bed by 6 AM feeling tired with occasional dryness of mouth but denies headaches. She naps about 1-2 hours on her days off. She drinks about 1 cup of coffee and denies excessive use of caffeinated beverages.  She is able to sleep on demand There is no history suggestive of cataplexy, sleep paralysis or parasomnias  She reports occasional wheezing for the last 2-3 years.  Although she did not have obvious heartburn, this improved with Protonix.  She then tried natural remedies has been And this seems to have controlled his symptoms.  She never needed MDIs.  She denies any history of asthma. She has blood pressure controlled with 2 meds    Past Medical History:  Diagnosis Date  . ALLERGIC RHINITIS 08/20/2008   Qualifier: Diagnosis of  By: Everardo AllEllison MD, Cleophas DunkerSean A   . ANEMIA-IRON DEFICIENCY 11/01/2009   Qualifier: Diagnosis of  By: Jonny RuizJohn MD, Len BlalockJames W   . DIABETES MELLITUS, GESTATIONAL, HX OF 11/01/2009   Qualifier: Diagnosis of  By: Jonny RuizJohn MD,  Len BlalockJames W   . ECZEMA 08/20/2008   Qualifier: Diagnosis of  By: Everardo AllEllison MD, Cleophas DunkerSean A   . HYPERCHOLESTEROLEMIA 08/20/2008   Qualifier: Diagnosis of  By: Everardo AllEllison MD, Cleophas DunkerSean A   . HYPERTENSION 02/11/2007   Qualifier: Diagnosis of  By: Everardo AllEllison MD, Cleophas DunkerSean A   . Hypertriglyceridemia 07/11/2013  . Impaired glucose tolerance 07/11/2013  . STRABISMUS 06/30/2010   Qualifier: Diagnosis of  By: Jonny RuizJohn MD, Len BlalockJames W   . VITAMIN D DEFICIENCY 11/01/2009   Qualifier: Diagnosis of  By: Jonny RuizJohn MD, Len BlalockJames W     Past Surgical History:  Procedure Laterality Date  . right knee surgury     arthroscopy  . TONSILLECTOMY      No Known Allergies  Social History   Socioeconomic History  . Marital status: Married    Spouse name: Not on file  . Number of children: Not on file  . Years of education: Not on file  . Highest education level: Not on file  Social Needs  . Financial resource strain: Not on file  . Food insecurity - worry: Not on file  . Food insecurity - inability: Not on file  . Transportation needs - medical: Not on file  . Transportation needs - non-medical: Not on file  Occupational History  . Not on file  Tobacco Use  . Smoking status: Never Smoker  . Smokeless tobacco: Never Used  Substance and Sexual Activity  . Alcohol use:  No  . Drug use: No  . Sexual activity: Not on file  Other Topics Concern  . Not on file  Social History Narrative  . Not on file    Family History  Problem Relation Age of Onset  . Hypertension Unknown   . Breast cancer Unknown      Review of Systems Positive for acid heartburn and intermittent wheezing  Constitutional: negative for anorexia, fevers and sweats  Eyes: negative for irritation, redness and visual disturbance  Ears, nose, mouth, throat, and face: negative for earaches, epistaxis, nasal congestion and sore throat  Respiratory: negative for cough, dyspnea on exertion, sputum   Cardiovascular: negative for chest pain, dyspnea, lower extremity edema,  orthopnea, palpitations and syncope  Gastrointestinal: negative for abdominal pain, constipation, diarrhea, melena, nausea and vomiting  Genitourinary:negative for dysuria, frequency and hematuria  Hematologic/lymphatic: negative for bleeding, easy bruising and lymphadenopathy  Musculoskeletal:negative for arthralgias, muscle weakness and stiff joints  Neurological: negative for coordination problems, gait problems, headaches and weakness  Endocrine: negative for diabetic symptoms including polydipsia, polyuria and weight loss      Objective:   Physical Exam  Gen. Pleasant, obese, in no distress, normal affect ENT - no tonsillar enlargement, no post nasal drip, class 2 airway Neck: No JVD, no thyromegaly, no carotid bruits Lungs: no use of accessory muscles, no dullness to percussion, decreased without rales or rhonchi  Cardiovascular: Rhythm regular, heart sounds  normal, no murmurs or gallops, no peripheral edema Abdomen: soft and non-tender, no hepatosplenomegaly, BS normal. Musculoskeletal: No deformities, no cyanosis or clubbing Neuro:  alert, non focal, no tremors        Assessment & Plan:

## 2017-09-14 DIAGNOSIS — Z630 Problems in relationship with spouse or partner: Secondary | ICD-10-CM | POA: Diagnosis not present

## 2017-10-16 DIAGNOSIS — G4733 Obstructive sleep apnea (adult) (pediatric): Secondary | ICD-10-CM | POA: Diagnosis not present

## 2017-10-17 ENCOUNTER — Telehealth: Payer: Self-pay | Admitting: Pulmonary Disease

## 2017-10-17 ENCOUNTER — Other Ambulatory Visit: Payer: Self-pay | Admitting: *Deleted

## 2017-10-17 DIAGNOSIS — G4710 Hypersomnia, unspecified: Secondary | ICD-10-CM

## 2017-10-17 NOTE — Telephone Encounter (Signed)
Will forward this to RA to see if this is sufficient for sleep study.

## 2017-10-18 ENCOUNTER — Other Ambulatory Visit: Payer: Self-pay | Admitting: Internal Medicine

## 2017-10-22 ENCOUNTER — Other Ambulatory Visit: Payer: Self-pay | Admitting: Sports Medicine

## 2017-10-22 DIAGNOSIS — G4733 Obstructive sleep apnea (adult) (pediatric): Secondary | ICD-10-CM | POA: Diagnosis not present

## 2017-10-22 NOTE — Telephone Encounter (Signed)
Her test showed mild OSA, 8/hour considering sleep time of 6 hours. If she thinks she only slept for 4 hours then sleep apnea would be much worse. If she feels so, we can repeat home sleep test  Would certainly consider treatment since she is symptomatic-which could be either CPAP or oral appliance based on her preference

## 2017-10-22 NOTE — Telephone Encounter (Signed)
Called and spoke with patient, she states that she was out of the country for a month before the test. Her second night back in [place] is when she had the test done. She was feeling very "jet lagged" and was only able to sleep about 4 hours.   RA please advise on this, thanks.

## 2017-10-24 NOTE — Telephone Encounter (Signed)
Okay to order split study

## 2017-10-24 NOTE — Telephone Encounter (Signed)
Spoke with patient. She is aware of the in-lab sleep study approval. Will go ahead and place order. Nothing else needed at time of call.

## 2017-10-24 NOTE — Telephone Encounter (Signed)
Called pt and advised message from the provider. Pt understood and verbalized understanding.   She would like to have sleep study done at the lab instead of home because she feels she will have a more of an  accurate reading. She feels she did not sleep very well and just wants a more accurate result. Dr. Vassie Loll can we order in lab sleep study? Please advise.

## 2017-11-14 ENCOUNTER — Ambulatory Visit (INDEPENDENT_AMBULATORY_CARE_PROVIDER_SITE_OTHER): Payer: 59 | Admitting: Orthopedic Surgery

## 2017-11-14 ENCOUNTER — Telehealth (INDEPENDENT_AMBULATORY_CARE_PROVIDER_SITE_OTHER): Payer: Self-pay

## 2017-11-14 ENCOUNTER — Other Ambulatory Visit (INDEPENDENT_AMBULATORY_CARE_PROVIDER_SITE_OTHER): Payer: Self-pay | Admitting: Radiology

## 2017-11-14 ENCOUNTER — Ambulatory Visit: Payer: Self-pay

## 2017-11-14 DIAGNOSIS — M5441 Lumbago with sciatica, right side: Secondary | ICD-10-CM

## 2017-11-14 DIAGNOSIS — M1712 Unilateral primary osteoarthritis, left knee: Secondary | ICD-10-CM | POA: Diagnosis not present

## 2017-11-14 MED ORDER — DICLOFENAC SODIUM 2 % TD SOLN: 2.0000 | Freq: Two times a day (BID) | TRANSDERMAL | 3 refills | Status: DC

## 2017-11-14 NOTE — Telephone Encounter (Signed)
Telephone call from pt.  Stating that she accidentally took the medication metadate- cd  . Took it around 12:00pm.  Pt. States her heart rate is approximately 100bpm.   Denies any fever  or any other sx.  Pt. States she tried to induce vomiting but not successful. Pt denies pregnancy. Informed pt that tacycardia is a side affect of the medication. Reviewed side affects of  medication online in [facility].  Provided home care advice  Pt. Voiced understanding.    Reason for Disposition . Nursing judgment or information in reference  Answer Assessment - Initial Assessment Questions 1. REASON FOR CALL: "What is your main concern right now?"     Pt. Accidentally took metadate- cd 20 mg.   2. ONSET: "When did the ___ start?"     Took med around 1200 3. SEVERITY: "How bad is the ___?"     100bpm heart rate 4. FEVER: "Do you have a fever?"     no 5. OTHER SYMPTOMS: "Do you have any other new symptoms?"     no 6. INTERVENTIONS AND RESPONSE: "What have you done so far to try to make this better? What medications have you used?"      Tried to induce vomiting 7. PREGNANCY: "Is there any chance you are pregnant?"     no  Protocols used: NO GUIDELINE AVAILABLE-A-AH

## 2017-11-14 NOTE — Telephone Encounter (Signed)
Submitted application online for Monovisc injection, bilateral knee. 

## 2017-11-16 DIAGNOSIS — Z1231 Encounter for screening mammogram for malignant neoplasm of breast: Secondary | ICD-10-CM | POA: Diagnosis not present

## 2017-11-18 ENCOUNTER — Encounter (HOSPITAL_BASED_OUTPATIENT_CLINIC_OR_DEPARTMENT_OTHER): Payer: 59

## 2017-11-18 ENCOUNTER — Encounter (INDEPENDENT_AMBULATORY_CARE_PROVIDER_SITE_OTHER): Payer: Self-pay | Admitting: Orthopedic Surgery

## 2017-11-18 NOTE — Progress Notes (Signed)
Office Visit Note   Patient: Ashley Wyatt           Date of Birth: 06/15/1976           MRN: [id number] Visit Date: 11/14/2017 Requested by: Corwin LevinsJohn, James W, MD [address] [place], [place] [id number] PCP: Corwin LevinsJohn, James W, MD  Subjective: Chief Complaint  Patient presents with  . Lower Back - Pain  . Left Knee - Pain  . Right Knee - Pain    HPI: Patient presents with several month history of back pain which has improved some over the last 4 days.  She was having right-sided low back pain with radiation into the leg.  Denied any numbness and tingling.  Did have some difficulty driving.  She is not taking any anti-inflammatories.  She has lost 10 pounds which has helped.  She is doing stretching from online sources.  She also reports bilateral knee pain.  She has known history of arthritis in both knees.  Last had gel injections several months ago which did not help as they have in the past.  They were done by another provider.              ROS: All systems reviewed are negative as they relate to the chief complaint within the history of present illness.  Patient denies  fevers or chills.   Assessment & Plan: Visit Diagnoses:  1. Arthritis of left knee   2. Acute bilateral low back pain with right-sided sciatica     Plan: Impression is low back pain improved over the last several days.  Plan is MRI scan of the lumbar spine if her symptoms recur.  She has used topical anti-inflammatory for her back and knees and that is helped.  We will arrange that as well.  I would like to preapproved for for gel injections to be done within the next 3 weeks.  I will see her back at that time  Follow-Up Instructions: Return in about 3 weeks (around 12/05/2017).   Orders:  No orders of the defined types were placed in this encounter.  No orders of the defined types were placed in this encounter.     Procedures: No procedures performed   Clinical Data: No additional  findings.  Objective: Vital Signs: There were no vitals taken for this visit.  Physical Exam:   Constitutional: Patient appears well-developed HEENT:  Head: Normocephalic Eyes:EOM are normal Neck: Normal range of motion Cardiovascular: Normal rate Pulmonary/chest: Effort normal Neurologic: Patient is alert Skin: Skin is warm Psychiatric: Patient has normal mood and affect    Ortho Exam: Orthopedic exam demonstrates full active and passive range of motion of both knees with only mild effusion.  Patient has slight valgus alignment.  Pedal pulses palpable.  No groin pain with internal or external rotation of the leg.  She has no nerve root tension signs and no paresthesias L1 S1 bilaterally.  Some pain with forward and lateral bending but no trochanteric tenderness is noted.  Specialty Comments:  No specialty comments available.  Imaging: No results found.   PMFS History: Patient Active Problem List   Diagnosis Date Noted  . Ganglion cyst 08/04/2017  . Ear mass, right 08/04/2017  . Rash 08/03/2017  . Acne 08/03/2017  . Hypersomnolence 12/01/2016  . Paresthesias 11/28/2016  . GERD (gastroesophageal reflux disease) 06/06/2016  . Impaired glucose tolerance 07/11/2013  . Hypertriglyceridemia 07/11/2013  . Obesity 07/11/2013  . Polyarthralgia 07/04/2013  . Encounter for well  adult exam with abnormal findings 11/18/2011  . STRABISMUS 06/30/2010  . Vitamin D deficiency 11/01/2009  . ANEMIA-IRON DEFICIENCY 11/01/2009  . URTICARIA 11/01/2009  . DIABETES MELLITUS, GESTATIONAL, HX OF 11/01/2009  . HYPERCHOLESTEROLEMIA 08/20/2008  . ALLERGIC RHINITIS 08/20/2008  . ECZEMA 08/20/2008  . FATIGUE 08/20/2008  . Essential hypertension 02/11/2007   Past Medical History:  Diagnosis Date  . ALLERGIC RHINITIS 08/20/2008   Qualifier: Diagnosis of  By: Everardo All MD, Cleophas Dunker   . ANEMIA-IRON DEFICIENCY 11/01/2009   Qualifier: Diagnosis of  By: [name] MD, Len Blalock   . DIABETES MELLITUS,  GESTATIONAL, HX OF 11/01/2009   Qualifier: Diagnosis of  By: [name] MD, Len Blalock   . ECZEMA 08/20/2008   Qualifier: Diagnosis of  By: Everardo All MD, Cleophas Dunker HYPERCHOLESTEROLEMIA 08/20/2008   Qualifier: Diagnosis of  By: Everardo All MD, Cleophas Dunker   . HYPERTENSION 02/11/2007   Qualifier: Diagnosis of  By: Everardo All MD, Cleophas Dunker   . Hypertriglyceridemia 07/11/2013  . Impaired glucose tolerance 07/11/2013  . STRABISMUS 06/30/2010   Qualifier: Diagnosis of  By: [name] MD, Len Blalock   . VITAMIN D DEFICIENCY 11/01/2009   Qualifier: Diagnosis of  By: [name] MD, Len Blalock     Family History  Problem Relation Age of Onset  . Hypertension Unknown   . Breast cancer Unknown     Past Surgical History:  Procedure Laterality Date  . right knee surgury     arthroscopy  . TONSILLECTOMY     Social History   Occupational History  . Not on file  Tobacco Use  . Smoking status: Never Smoker  . Smokeless tobacco: Never Used  Substance and Sexual Activity  . Alcohol use: No  . Drug use: No  . Sexual activity: Not on file

## 2017-11-19 ENCOUNTER — Telehealth (INDEPENDENT_AMBULATORY_CARE_PROVIDER_SITE_OTHER): Payer: Self-pay

## 2017-11-19 NOTE — Telephone Encounter (Signed)
Submitted Rx form through [employer] portal for Monovisc injection, bilateral knee.

## 2017-11-23 ENCOUNTER — Telehealth (INDEPENDENT_AMBULATORY_CARE_PROVIDER_SITE_OTHER): Payer: Self-pay | Admitting: Orthopedic Surgery

## 2017-11-23 NOTE — Telephone Encounter (Signed)
[facility] is needing the enrollment form for monovisc faxed to # [phone]

## 2017-11-26 ENCOUNTER — Telehealth (INDEPENDENT_AMBULATORY_CARE_PROVIDER_SITE_OTHER): Payer: Self-pay | Admitting: Orthopedic Surgery

## 2017-11-26 ENCOUNTER — Telehealth (INDEPENDENT_AMBULATORY_CARE_PROVIDER_SITE_OTHER): Payer: Self-pay

## 2017-11-26 DIAGNOSIS — M5441 Lumbago with sciatica, right side: Secondary | ICD-10-CM

## 2017-11-26 NOTE — Telephone Encounter (Signed)
Duplicate

## 2017-11-26 NOTE — Telephone Encounter (Signed)
Patient would like an order for an MRI to be put in as well a referral for therapy. Patients # [id number]

## 2017-11-26 NOTE — Telephone Encounter (Signed)
At patients last office visit your plan was Plan is MRI scan of the lumbar spine if her symptoms recur.  She has used topical anti-inflammatory for her back and knees and that is helped.  We will arrange that as well.  I would like to preapproved for for gel injections to be done within the next 3 weeks.  I will see her back at that time Please advise, patient request PT and MRI

## 2017-11-26 NOTE — Telephone Encounter (Signed)
Talked with Claris [facility]addenGale with [facility] and was advised that Monovisc enrollment form will be faxed.

## 2017-11-27 NOTE — Telephone Encounter (Signed)
Ok for mri pls calal thx

## 2017-11-27 NOTE — Telephone Encounter (Signed)
Ok for PT as well? Please advise.

## 2017-11-27 NOTE — Telephone Encounter (Signed)
y

## 2017-11-28 DIAGNOSIS — Z630 Problems in relationship with spouse or partner: Secondary | ICD-10-CM | POA: Diagnosis not present

## 2017-11-28 NOTE — Telephone Encounter (Signed)
I tried calling patient back. No answer. LMVM for her advising we had put order in for MRI scan but needed her to call us back to let us know where she would like to go to PT so we can make referral for her.

## 2017-11-29 ENCOUNTER — Telehealth (INDEPENDENT_AMBULATORY_CARE_PROVIDER_SITE_OTHER): Payer: Self-pay | Admitting: Orthopedic Surgery

## 2017-11-29 DIAGNOSIS — M5441 Lumbago with sciatica, right side: Secondary | ICD-10-CM

## 2017-11-29 NOTE — Telephone Encounter (Signed)
Patient returned a call back to you stating [facility] & [phone]  Please call patient to advise

## 2017-11-30 NOTE — Telephone Encounter (Signed)
Order sent.

## 2017-12-05 ENCOUNTER — Telehealth (INDEPENDENT_AMBULATORY_CARE_PROVIDER_SITE_OTHER): Payer: Self-pay

## 2017-12-05 NOTE — Telephone Encounter (Signed)
Talked with Junece with [facility] and was advised that patient needs to give consent for medication (Monovisc) to be delivered to our office.  Once consent is received, we will receive a call to schedule delivery.  Will advise patient.

## 2017-12-05 NOTE — Telephone Encounter (Signed)
Called and advised patient to call [facility] to give consent for Monovisc injection to be delivered to our office.  [facility]# is [phone].

## 2017-12-06 ENCOUNTER — Telehealth (INDEPENDENT_AMBULATORY_CARE_PROVIDER_SITE_OTHER): Payer: Self-pay

## 2017-12-06 NOTE — Telephone Encounter (Signed)
Patient called stating that she was told by [facility] that clinical information is needing to process order for Monovisc injection, bilateral knee. Talked with [facility] to verify what information is needed to process order.  Will fax clinical office notes to [facility].  Faxed clincal office notes to [facility] at [phone].

## 2017-12-07 ENCOUNTER — Telehealth (INDEPENDENT_AMBULATORY_CARE_PROVIDER_SITE_OTHER): Payer: Self-pay | Admitting: Orthopedic Surgery

## 2017-12-07 NOTE — Telephone Encounter (Signed)
Have either of you seen this? I have not?

## 2017-12-07 NOTE — Telephone Encounter (Signed)
Patient called stating [facility] faxed over more forms this morning to be filled out in order for authorization to deliver the monovisc for her appt on Friday. She said this needs to be done today. Patients # [phone]

## 2017-12-07 NOTE — Telephone Encounter (Signed)
Form faxed to [facility], patient advised.

## 2017-12-07 NOTE — Telephone Encounter (Signed)
Cora from [facility] called left voicemail message stating that the monovisc does require a medical policy drug review. Ivor MessierCora said she is sending over an enrollment form to be filled out and faxed back. The fax# is [phone]   The phone # is [phone]

## 2017-12-07 NOTE — Telephone Encounter (Signed)
I have done this, so sorry, I had another message, did not realize you had this one.

## 2017-12-10 DIAGNOSIS — M170 Bilateral primary osteoarthritis of knee: Secondary | ICD-10-CM | POA: Diagnosis not present

## 2017-12-10 NOTE — Telephone Encounter (Signed)
IC patient and advised.  She already has a Friday appt with Dr August Saucerean

## 2017-12-10 NOTE — Telephone Encounter (Signed)
[facility] said Monovisc will be delivered Wednesday 6/26, signature required.

## 2017-12-12 ENCOUNTER — Ambulatory Visit (HOSPITAL_BASED_OUTPATIENT_CLINIC_OR_DEPARTMENT_OTHER): Payer: 59 | Attending: Pulmonary Disease | Admitting: Pulmonary Disease

## 2017-12-12 VITALS — Ht 65.0 in | Wt 215.0 lb

## 2017-12-12 DIAGNOSIS — G4733 Obstructive sleep apnea (adult) (pediatric): Secondary | ICD-10-CM | POA: Insufficient documentation

## 2017-12-12 DIAGNOSIS — G4710 Hypersomnia, unspecified: Secondary | ICD-10-CM | POA: Diagnosis present

## 2017-12-14 ENCOUNTER — Ambulatory Visit (INDEPENDENT_AMBULATORY_CARE_PROVIDER_SITE_OTHER): Payer: 59 | Admitting: Orthopedic Surgery

## 2017-12-14 DIAGNOSIS — G4733 Obstructive sleep apnea (adult) (pediatric): Secondary | ICD-10-CM

## 2017-12-14 DIAGNOSIS — M1711 Unilateral primary osteoarthritis, right knee: Secondary | ICD-10-CM

## 2017-12-14 DIAGNOSIS — M1712 Unilateral primary osteoarthritis, left knee: Secondary | ICD-10-CM

## 2017-12-14 NOTE — Procedures (Signed)
Patient Name: Ashley Wyatt, Ashley Wyatt: 12/12/2017 Gender: Female D.O.B: 07/21/1975 Age (years): [age] Referring Provider: Cyril Mourningakesh Esmee Fallaw MD, ABSM Height (inches): 65 Interpreting Physician: Cyril Mourningakesh Thersea Manfredonia MD, ABSM Weight (lbs): 215 RPSGT: Shelah LewandowskyGregory, [name] BMI: 36 MRN: [id number] Neck Size: 16.00 <br> <br> CLINICAL INFORMATION Sleep Study Type: NPSG  Indication for sleep study: Excessive Daytime Sleepiness, Fatigue, Hypertension, Obesity, Snoring  Epworth Sleepiness Score: 10    SLEEP STUDY TECHNIQUE As per the AASM Manual for the Scoring of Sleep and Associated Events v2.3 (April 2016) with a hypopnea requiring 4% desaturations.  The channels recorded and monitored were frontal, central and occipital EEG, electrooculogram (EOG), submentalis EMG (chin), nasal and oral airflow, thoracic and abdominal wall motion, anterior tibialis EMG, snore microphone, electrocardiogram, and pulse oximetry.  MEDICATIONS Medications self-administered by patient taken the night of the study : DIPHENDRAMINE  SLEEP ARCHITECTURE The study was initiated at 10:26:03 PM and ended at 4:53:01 AM.  Sleep onset time was 4.8 minutes and the sleep efficiency was 86.4%%. The total sleep time was 334.1 minutes.  Stage REM latency was 161.5 minutes.  The patient spent 13.1%% of the night in stage N1 sleep, 63.7%% in stage N2 sleep, 0.0%% in stage N3 and 23.19% in REM.  Alpha intrusion was absent.  Supine sleep was 31.13%.  RESPIRATORY PARAMETERS The overall apnea/hypopnea index (AHI) was 5.9 per hour. There were 0 total apneas, including 0 obstructive, 0 central and 0 mixed apneas. There were 33 hypopneas and 14 RERAs.  The AHI during Stage REM sleep was 24.0 per hour.  AHI while supine was 16.7 per hour.  The mean oxygen saturation was 95.7%. The minimum SpO2 during sleep was 76.0%.  moderate snoring was noted during this study.  CARDIAC DATA The 2 lead EKG demonstrated sinus rhythm. The mean heart  rate was 63.2 beats per minute. Other EKG findings include: None.   LEG MOVEMENT DATA The total PLMS were 0 with a resulting PLMS index of 0.0. Associated arousal with leg movement index was 3.1 .  IMPRESSIONS - Mild obstructive sleep apnea occurred during this study (AHI = 5.9/h). - No significant central sleep apnea occurred during this study (CAI = 0.0/h). - Moderate oxygen desaturation was noted during this study (Min O2 = 76.0%). - The patient snored with moderate snoring volume. - No cardiac abnormalities were noted during this study. - Clinically significant periodic limb movements did not occur during sleep. No significant associated arousals.   DIAGNOSIS - MIld Obstructive Sleep Apnea, predominantly REM related  (327.23 [G47.33 ICD-10]) - Note that this study was performed using a biteguard   RECOMMENDATIONS - Positional therapy avoiding supine position during sleep. - Return to discuss treatment options. - Avoid alcohol, sedatives and other CNS depressants that may worsen sleep apnea and disrupt normal sleep architecture. - Sleep hygiene should be reviewed to assess factors that may improve sleep quality. - Weight management and regular exercise should be initiated or continued if appropriate.   Cyril Mourningakesh Cortasia Screws MD Board Certified in Sleep medicine

## 2017-12-15 ENCOUNTER — Encounter (INDEPENDENT_AMBULATORY_CARE_PROVIDER_SITE_OTHER): Payer: Self-pay | Admitting: Orthopedic Surgery

## 2017-12-15 DIAGNOSIS — M1712 Unilateral primary osteoarthritis, left knee: Secondary | ICD-10-CM | POA: Diagnosis not present

## 2017-12-15 DIAGNOSIS — M1711 Unilateral primary osteoarthritis, right knee: Secondary | ICD-10-CM | POA: Diagnosis not present

## 2017-12-15 MED ORDER — LIDOCAINE HCL 1 % IJ SOLN
5.0000 mL | INTRAMUSCULAR | Status: AC | PRN
  Administered 2017-12-15: 5 mL

## 2017-12-15 NOTE — Progress Notes (Signed)
   Procedure Note  Patient: Brunilda PayorGayatri Casciano             Date of Birth: 02/10/1976           MRN: [id number]             Visit Date: 12/14/2017  Procedures: Visit Diagnoses: Unilateral primary osteoarthritis, left knee  Unilateral primary osteoarthritis, right knee  Large Joint Inj: bilateral knee on 12/15/2017 9:43 AM Indications: diagnostic evaluation, joint swelling and pain Details: 18 G 1.5 in needle, superolateral approach  Arthrogram: No  Medications (Right): 5 mL lidocaine 1 % Medications (Left): 5 mL lidocaine 1 % Outcome: tolerated well, no immediate complications Procedure, treatment alternatives, risks and benefits explained, specific risks discussed. Consent was given by the patient. Immediately prior to procedure a time out was called to verify the correct patient, procedure, equipment, support staff and site/side marked as required. Patient was prepped and draped in the usual sterile fashion.    Bilateral Monovisc injected into each knee through specialty pharmacy which was not a buy an bill clinic visit

## 2017-12-24 ENCOUNTER — Telehealth (INDEPENDENT_AMBULATORY_CARE_PROVIDER_SITE_OTHER): Payer: Self-pay | Admitting: Orthopedic Surgery

## 2017-12-24 NOTE — Telephone Encounter (Signed)
Arlene from [facility] left voicemail message stating patient was previously sch'd for a MRI on 12/04/17, pt N/S & is calling today to r/s the MRI to 12/31/17. Arlene req'd a call from Dr August Saucerean office to comfirm if it's ok to schedule mri. Arlene's callback # [phone]

## 2017-12-24 NOTE — Telephone Encounter (Signed)
IC Arlene back and advised ok to reschedule.

## 2017-12-26 ENCOUNTER — Telehealth (INDEPENDENT_AMBULATORY_CARE_PROVIDER_SITE_OTHER): Payer: Self-pay | Admitting: Orthopedic Surgery

## 2017-12-26 NOTE — Telephone Encounter (Signed)
Patient left a message stating that she is scheduled for an MRI on Monday and was told that she would be going in head first and feels like she needs some medication to relax for the MRI.  CB#[phone].  Thank you.

## 2017-12-26 NOTE — Telephone Encounter (Signed)
Ok for meds.

## 2017-12-27 MED ORDER — DIAZEPAM 5 MG PO TABS: ORAL_TABLET | ORAL | 0 refills | Status: DC

## 2017-12-27 NOTE — Telephone Encounter (Signed)
y

## 2017-12-27 NOTE — Telephone Encounter (Signed)
Called to pharmacy. Advised patient done.  

## 2017-12-31 DIAGNOSIS — M545 Low back pain: Secondary | ICD-10-CM | POA: Diagnosis not present

## 2018-01-07 DIAGNOSIS — R531 Weakness: Secondary | ICD-10-CM | POA: Diagnosis not present

## 2018-01-07 DIAGNOSIS — R262 Difficulty in walking, not elsewhere classified: Secondary | ICD-10-CM | POA: Diagnosis not present

## 2018-01-07 DIAGNOSIS — M5441 Lumbago with sciatica, right side: Secondary | ICD-10-CM | POA: Diagnosis not present

## 2018-01-09 ENCOUNTER — Ambulatory Visit: Payer: 59 | Admitting: Pulmonary Disease

## 2018-01-09 ENCOUNTER — Telehealth: Payer: Self-pay | Admitting: Pulmonary Disease

## 2018-01-09 NOTE — Telephone Encounter (Signed)
Please schedule sooner office visit, I will open office for next week

## 2018-01-09 NOTE — Telephone Encounter (Signed)
Spoke with the pt  She states wanting to review her most recent sleep study results  She is currently scheduled with Dr Vassie LollAlva in Sept but does not want to wait until then  She refuses to see an NP  She is also requesting a copy of the results  Please advise thanks

## 2018-01-10 NOTE — Telephone Encounter (Signed)
Left message for patient to call back. RA has openings for tomorrow 7/26.

## 2018-01-11 NOTE — Telephone Encounter (Signed)
Spoke with patient. She stated that she would not be able to make it in today since she is stuck at work. Advised patient that RA has requested that she comes into the office for her results. She will call back once she has figured out her work schedule.

## 2018-01-15 DIAGNOSIS — M9903 Segmental and somatic dysfunction of lumbar region: Secondary | ICD-10-CM | POA: Diagnosis not present

## 2018-01-15 DIAGNOSIS — M9901 Segmental and somatic dysfunction of cervical region: Secondary | ICD-10-CM | POA: Diagnosis not present

## 2018-01-15 DIAGNOSIS — M9902 Segmental and somatic dysfunction of thoracic region: Secondary | ICD-10-CM | POA: Diagnosis not present

## 2018-01-15 DIAGNOSIS — Z01419 Encounter for gynecological examination (general) (routine) without abnormal findings: Secondary | ICD-10-CM | POA: Diagnosis not present

## 2018-01-16 DIAGNOSIS — M9902 Segmental and somatic dysfunction of thoracic region: Secondary | ICD-10-CM | POA: Diagnosis not present

## 2018-01-16 DIAGNOSIS — M9903 Segmental and somatic dysfunction of lumbar region: Secondary | ICD-10-CM | POA: Diagnosis not present

## 2018-01-16 DIAGNOSIS — M9901 Segmental and somatic dysfunction of cervical region: Secondary | ICD-10-CM | POA: Diagnosis not present

## 2018-01-17 DIAGNOSIS — M9901 Segmental and somatic dysfunction of cervical region: Secondary | ICD-10-CM | POA: Diagnosis not present

## 2018-01-17 DIAGNOSIS — M9902 Segmental and somatic dysfunction of thoracic region: Secondary | ICD-10-CM | POA: Diagnosis not present

## 2018-01-17 DIAGNOSIS — M9903 Segmental and somatic dysfunction of lumbar region: Secondary | ICD-10-CM | POA: Diagnosis not present

## 2018-01-23 DIAGNOSIS — M9901 Segmental and somatic dysfunction of cervical region: Secondary | ICD-10-CM | POA: Diagnosis not present

## 2018-01-23 DIAGNOSIS — M9903 Segmental and somatic dysfunction of lumbar region: Secondary | ICD-10-CM | POA: Diagnosis not present

## 2018-01-23 DIAGNOSIS — M9902 Segmental and somatic dysfunction of thoracic region: Secondary | ICD-10-CM | POA: Diagnosis not present

## 2018-01-25 DIAGNOSIS — M5431 Sciatica, right side: Secondary | ICD-10-CM | POA: Diagnosis not present

## 2018-01-25 DIAGNOSIS — M25561 Pain in right knee: Secondary | ICD-10-CM | POA: Diagnosis not present

## 2018-01-25 DIAGNOSIS — M545 Low back pain: Secondary | ICD-10-CM | POA: Diagnosis not present

## 2018-01-29 DIAGNOSIS — M62838 Other muscle spasm: Secondary | ICD-10-CM | POA: Diagnosis not present

## 2018-01-29 DIAGNOSIS — M9901 Segmental and somatic dysfunction of cervical region: Secondary | ICD-10-CM | POA: Diagnosis not present

## 2018-01-29 DIAGNOSIS — M9903 Segmental and somatic dysfunction of lumbar region: Secondary | ICD-10-CM | POA: Diagnosis not present

## 2018-01-29 DIAGNOSIS — M9902 Segmental and somatic dysfunction of thoracic region: Secondary | ICD-10-CM | POA: Diagnosis not present

## 2018-01-29 DIAGNOSIS — M6281 Muscle weakness (generalized): Secondary | ICD-10-CM | POA: Diagnosis not present

## 2018-02-05 DIAGNOSIS — M9902 Segmental and somatic dysfunction of thoracic region: Secondary | ICD-10-CM | POA: Diagnosis not present

## 2018-02-05 DIAGNOSIS — M9903 Segmental and somatic dysfunction of lumbar region: Secondary | ICD-10-CM | POA: Diagnosis not present

## 2018-02-05 DIAGNOSIS — M9901 Segmental and somatic dysfunction of cervical region: Secondary | ICD-10-CM | POA: Diagnosis not present

## 2018-02-08 ENCOUNTER — Ambulatory Visit (INDEPENDENT_AMBULATORY_CARE_PROVIDER_SITE_OTHER): Payer: 59 | Admitting: Orthopedic Surgery

## 2018-02-08 ENCOUNTER — Encounter (INDEPENDENT_AMBULATORY_CARE_PROVIDER_SITE_OTHER): Payer: Self-pay | Admitting: Orthopedic Surgery

## 2018-02-08 DIAGNOSIS — G8929 Other chronic pain: Secondary | ICD-10-CM | POA: Diagnosis not present

## 2018-02-08 DIAGNOSIS — R102 Pelvic and perineal pain: Secondary | ICD-10-CM | POA: Diagnosis not present

## 2018-02-08 DIAGNOSIS — R5383 Other fatigue: Secondary | ICD-10-CM | POA: Diagnosis not present

## 2018-02-08 DIAGNOSIS — M5441 Lumbago with sciatica, right side: Secondary | ICD-10-CM

## 2018-02-08 DIAGNOSIS — M62838 Other muscle spasm: Secondary | ICD-10-CM | POA: Diagnosis not present

## 2018-02-08 DIAGNOSIS — M6281 Muscle weakness (generalized): Secondary | ICD-10-CM | POA: Diagnosis not present

## 2018-02-08 DIAGNOSIS — E559 Vitamin D deficiency, unspecified: Secondary | ICD-10-CM | POA: Diagnosis not present

## 2018-02-09 ENCOUNTER — Encounter (INDEPENDENT_AMBULATORY_CARE_PROVIDER_SITE_OTHER): Payer: Self-pay | Admitting: Orthopedic Surgery

## 2018-02-09 NOTE — Progress Notes (Signed)
Office Visit Note   Patient: Ashley PayorGayatri Wyatt           Date of Birth: 06/19/1976           MRN: [id number] Visit Date: 02/08/2018 Requested by: Corwin LevinsJohn, James W, MD [address] [place] [place], [place] [address] PCP: Corwin LevinsJohn, James W, MD  Subjective: Chief Complaint  Patient presents with  . Follow-up    HPI: Patient presents for evaluation of bilateral knees and back.  Since I have seen her she has had L-spine MRI.  That scan is reviewed with her today and it shows on the right-hand side foraminal disc protrusion at L5-S1.  She has been going to the physical therapist and chiropractor and currently is not really having any issues with her back.  At times prior to resolution of her symptoms she was having right-sided buttock pain radiating down the leg.  She also states that Synvisc works much better for her knees than the Monovisc.  She has lateral compartment arthritis in both knees.  He currently can live with her situation.              ROS: All systems reviewed are negative as they relate to the chief complaint within the history of present illness.  Patient denies  fevers or chills.   Assessment & Plan: Visit Diagnoses:  1. Chronic right-sided low back pain with right-sided sciatica     Plan: Impression is right sided low back pain with foraminal disc at that level at L5-S1.  She is currently asymptomatic and functional having been with the physical therapist and chiropractor.  Plan is continued observation however if she becomes symptomatic I would favor epidural steroid injections with Dr. Alvester MorinNewton and she will call me to arrange the same.  Follow-Up Instructions: Return if symptoms worsen or fail to improve.   Orders:  No orders of the defined types were placed in this encounter.  No orders of the defined types were placed in this encounter.     Procedures: No procedures performed   Clinical Data: No additional findings.  Objective: Vital Signs: There were no vitals taken  for this visit.  Physical Exam:   Constitutional: Patient appears well-developed HEENT:  Head: Normocephalic Eyes:EOM are normal Neck: Normal range of motion Cardiovascular: Normal rate Pulmonary/chest: Effort normal Neurologic: Patient is alert Skin: Skin is warm Psychiatric: Patient has normal mood and affect    Ortho Exam: Ortho exam demonstrates full active and passive range of motion of the knees.  No nerve root tension signs.  Normal gait alignment.  Palpable people pulses.  No other masses lymphadenopathy or skin changes noted in that back region.  Specialty Comments:  No specialty comments available.  Imaging: No results found.   PMFS History: Patient Active Problem List   Diagnosis Date Noted  . Ganglion cyst 08/04/2017  . Ear mass, right 08/04/2017  . Rash 08/03/2017  . Acne 08/03/2017  . Hypersomnolence 12/01/2016  . Paresthesias 11/28/2016  . GERD (gastroesophageal reflux disease) 06/06/2016  . Impaired glucose tolerance 07/11/2013  . Hypertriglyceridemia 07/11/2013  . Obesity 07/11/2013  . Polyarthralgia 07/04/2013  . Encounter for well adult exam with abnormal findings 11/18/2011  . STRABISMUS 06/30/2010  . Vitamin D deficiency 11/01/2009  . ANEMIA-IRON DEFICIENCY 11/01/2009  . URTICARIA 11/01/2009  . DIABETES MELLITUS, GESTATIONAL, HX OF 11/01/2009  . HYPERCHOLESTEROLEMIA 08/20/2008  . ALLERGIC RHINITIS 08/20/2008  . ECZEMA 08/20/2008  . FATIGUE 08/20/2008  . Essential hypertension 02/11/2007   Past Medical  History:  Diagnosis Date  . ALLERGIC RHINITIS 08/20/2008   Qualifier: Diagnosis of  By: Everardo All MD, Cleophas Dunker   . ANEMIA-IRON DEFICIENCY 11/01/2009   Qualifier: Diagnosis of  By: [name] MD, Len Blalock   . DIABETES MELLITUS, GESTATIONAL, HX OF 11/01/2009   Qualifier: Diagnosis of  By: [name] MD, Len Blalock   . ECZEMA 08/20/2008   Qualifier: Diagnosis of  By: Everardo All MD, Cleophas Dunker HYPERCHOLESTEROLEMIA 08/20/2008   Qualifier: Diagnosis of  By: Everardo All MD,  Cleophas Dunker   . HYPERTENSION 02/11/2007   Qualifier: Diagnosis of  By: Everardo All MD, Cleophas Dunker   . Hypertriglyceridemia 07/11/2013  . Impaired glucose tolerance 07/11/2013  . STRABISMUS 06/30/2010   Qualifier: Diagnosis of  By: [name] MD, Len Blalock   . VITAMIN D DEFICIENCY 11/01/2009   Qualifier: Diagnosis of  By: [name] MD, Len Blalock     Family History  Problem Relation Age of Onset  . Hypertension Unknown   . Breast cancer Unknown     Past Surgical History:  Procedure Laterality Date  . right knee surgury     arthroscopy  . TONSILLECTOMY     Social History   Occupational History  . Not on file  Tobacco Use  . Smoking status: Never Smoker  . Smokeless tobacco: Never Used  Substance and Sexual Activity  . Alcohol use: No  . Drug use: No  . Sexual activity: Not on file

## 2018-02-12 ENCOUNTER — Telehealth: Payer: Self-pay

## 2018-02-12 NOTE — Telephone Encounter (Signed)
Copied from CRM [phone]#[id number]. Topic: General - Other >> Feb 08, 2018  9:35 AM Burchel, Abbi R wrote: Reason for CRM:   Pt would like to know if she can get Hep A and B Vaccines and what brand the offices uses. She would also like to inquire about costs.  Please call pt to advise/schedule.    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

## 2018-02-14 ENCOUNTER — Telehealth: Payer: Self-pay | Admitting: Pulmonary Disease

## 2018-02-14 NOTE — Telephone Encounter (Signed)
Spoke with patient, copy of sleep study placed up front. Patient voiced understanding. Nothing further needed at this time.

## 2018-02-15 ENCOUNTER — Ambulatory Visit (INDEPENDENT_AMBULATORY_CARE_PROVIDER_SITE_OTHER): Payer: 59 | Admitting: Emergency Medicine

## 2018-02-15 DIAGNOSIS — Z23 Encounter for immunization: Secondary | ICD-10-CM | POA: Diagnosis not present

## 2018-02-15 NOTE — Progress Notes (Signed)
Medical screening examination/treatment/procedure(s) were performed by non-physician practitioner and as supervising physician I was immediately available for consultation/collaboration. I agree with above. Ravon Mcilhenny, MD   

## 2018-03-06 ENCOUNTER — Ambulatory Visit: Payer: 59 | Admitting: Pulmonary Disease

## 2018-03-15 ENCOUNTER — Ambulatory Visit: Payer: 59 | Admitting: Pulmonary Disease

## 2018-03-20 ENCOUNTER — Ambulatory Visit (INDEPENDENT_AMBULATORY_CARE_PROVIDER_SITE_OTHER): Payer: 59 | Admitting: Emergency Medicine

## 2018-03-20 DIAGNOSIS — Z23 Encounter for immunization: Secondary | ICD-10-CM

## 2018-03-25 ENCOUNTER — Other Ambulatory Visit: Payer: Self-pay | Admitting: Internal Medicine

## 2018-05-02 ENCOUNTER — Telehealth (INDEPENDENT_AMBULATORY_CARE_PROVIDER_SITE_OTHER): Payer: Self-pay | Admitting: Orthopedic Surgery

## 2018-05-02 NOTE — Telephone Encounter (Signed)
Noted  

## 2018-05-02 NOTE — Telephone Encounter (Signed)
Patient request approval started for Synvisc injection. She states she can only have Synvisc, last time she was given Monovisc and had a bad reaction.

## 2018-05-07 ENCOUNTER — Telehealth (INDEPENDENT_AMBULATORY_CARE_PROVIDER_SITE_OTHER): Payer: Self-pay

## 2018-05-07 NOTE — Telephone Encounter (Signed)
Submitted VOB for SynviscOne, bilateral knee.  Patient preferred SynviscOne, due to having a reaction to Monovisc.

## 2018-05-13 ENCOUNTER — Telehealth (INDEPENDENT_AMBULATORY_CARE_PROVIDER_SITE_OTHER): Payer: Self-pay

## 2018-05-13 NOTE — Telephone Encounter (Signed)
PA required for SynviscOne, bilateral knee. [facility] [employer], PA Pending. Pending PA# [id number]  Per Vickii Pennaharlotte E.  Office notes need to be faxed to [phone].  Faxed office notes to [phone].  Appointment has to be scheduled after 06/15/2018 with Dr. August Saucerean.

## 2018-05-21 ENCOUNTER — Telehealth (INDEPENDENT_AMBULATORY_CARE_PROVIDER_SITE_OTHER): Payer: Self-pay | Admitting: Orthopedic Surgery

## 2018-05-21 NOTE — Telephone Encounter (Signed)
Patient called asked for a call back concerning the knee injection. The number to contact patient is [phone]

## 2018-05-21 NOTE — Telephone Encounter (Signed)
Talked with patient and advised her that I received a call from Jasmine DecemberSharon, RN with [facility] stating that her PA would be denied being that she has not tried/failed the preferred products, which are Euflexxa, Durolane, and Gelsyn-3.  Advised patient that I would check with Dr. August Saucerean to see which product he prefers.  Patient stated that she understands and that she will also do some research on the preferred products.

## 2018-05-28 ENCOUNTER — Telehealth (INDEPENDENT_AMBULATORY_CARE_PROVIDER_SITE_OTHER): Payer: Self-pay

## 2018-05-28 NOTE — Telephone Encounter (Signed)
Talked with patient and advised her that per Dr. August Saucerean, Euflexxa would be best gel injection for bilateral knee.  Advised patient that Euflexxa is a series of 3.  Patient stated that she understands.  Submitted VOB for Euflexxa, bilateral knee. Appt. 06/17/2018 for Euflexxa #1

## 2018-05-29 ENCOUNTER — Telehealth (INDEPENDENT_AMBULATORY_CARE_PROVIDER_SITE_OTHER): Payer: Self-pay

## 2018-05-29 NOTE — Telephone Encounter (Signed)
Patient is approved for Euflexxa series, bilateral knee. Buy & Bill Patient will be responsible for 10% OOP. May have a co-pay of $20.00? No PA required  Appt.06/17/2018 with Dr. Prince RomeHilts

## 2018-06-07 ENCOUNTER — Encounter (INDEPENDENT_AMBULATORY_CARE_PROVIDER_SITE_OTHER): Payer: Self-pay | Admitting: Family Medicine

## 2018-06-07 ENCOUNTER — Ambulatory Visit (INDEPENDENT_AMBULATORY_CARE_PROVIDER_SITE_OTHER): Payer: 59 | Admitting: Family Medicine

## 2018-06-07 DIAGNOSIS — M25562 Pain in left knee: Secondary | ICD-10-CM

## 2018-06-07 DIAGNOSIS — M25561 Pain in right knee: Secondary | ICD-10-CM | POA: Diagnosis not present

## 2018-06-07 DIAGNOSIS — G8929 Other chronic pain: Secondary | ICD-10-CM

## 2018-06-07 NOTE — Progress Notes (Addendum)
Office Visit Note   Patient: Ashley Wyatt           Date of Birth: 10/24/1975           MRN: [id number] Visit Date: 06/07/2018 Requested by: Corwin LevinsJohn, James W, MD [address] [place] [id number] PCP: Corwin LevinsJohn, James W, MD  Subjective: Chief Complaint  Patient presents with  . Right Knee - Pain    Posterior knee pain and swelling, worse since yesterday - had trouble walking yesterday.  Pain radiates down to right ankle.    HPI: She is a 42 year old with right greater than left knee pain.  History of osteoarthritis in both of her knees.  She would like to have Visco supplementation again.  Insurance has approved Euflexxa but not until December 30.  Her right knee has been bothering her quite a bit lately, she is not sure whether it is sciatica or whether she might have a Baker's cyst in the back of her knee.              ROS: Otherwise noncontributory  Objective: Vital Signs: There were no vitals taken for this visit.  Physical Exam:  Right knee: 1-2+ effusion with no warmth or erythema.  No definite Baker's cyst palpable.  Tender on the medial joint line and lateral joint line.  1+ laxity with valgus stress but solid endpoint. Left knee: 1+ effusion with no warmth or erythema.  Tender on the medial joint line.  1+ laxity with valgus stress but still a solid endpoint.  Imaging: I briefly imaged the posterior right knee with ultrasound and she may have a very small Baker's cyst but not big enough to justify aspiration.  Assessment & Plan: 1.  Right greater than left knee pain most likely due to DJD.  Cannot completely rule out loose body or recurrent medial meniscus tear right knee. -MRI right knee.  If no indication for surgery, then return for Euflexxa injections. -She will continue with chiropractic for her sciatica pain.    Follow-Up Instructions: No follow-ups on file.      Procedures: No procedures performed  No notes on file    PMFS History: Patient Active  Problem List   Diagnosis Date Noted  . Ganglion cyst 08/04/2017  . Ear mass, right 08/04/2017  . Rash 08/03/2017  . Acne 08/03/2017  . Hypersomnolence 12/01/2016  . Paresthesias 11/28/2016  . GERD (gastroesophageal reflux disease) 06/06/2016  . Impaired glucose tolerance 07/11/2013  . Hypertriglyceridemia 07/11/2013  . Obesity 07/11/2013  . Polyarthralgia 07/04/2013  . Encounter for well adult exam with abnormal findings 11/18/2011  . STRABISMUS 06/30/2010  . Vitamin D deficiency 11/01/2009  . ANEMIA-IRON DEFICIENCY 11/01/2009  . URTICARIA 11/01/2009  . DIABETES MELLITUS, GESTATIONAL, HX OF 11/01/2009  . HYPERCHOLESTEROLEMIA 08/20/2008  . ALLERGIC RHINITIS 08/20/2008  . ECZEMA 08/20/2008  . FATIGUE 08/20/2008  . Essential hypertension 02/11/2007   Past Medical History:  Diagnosis Date  . ALLERGIC RHINITIS 08/20/2008   Qualifier: Diagnosis of  By: Everardo AllEllison MD, Cleophas DunkerSean A   . ANEMIA-IRON DEFICIENCY 11/01/2009   Qualifier: Diagnosis of  By: Jonny RuizJohn MD, Len BlalockJames W   . DIABETES MELLITUS, GESTATIONAL, HX OF 11/01/2009   Qualifier: Diagnosis of  By: Jonny RuizJohn MD, Len BlalockJames W   . ECZEMA 08/20/2008   Qualifier: Diagnosis of  By: Everardo AllEllison MD, Cleophas DunkerSean A   . HYPERCHOLESTEROLEMIA 08/20/2008   Qualifier: Diagnosis of  By: Everardo AllEllison MD, Cleophas DunkerSean A   . HYPERTENSION 02/11/2007   Qualifier: Diagnosis of  By: Everardo AllEllison MD, Cleophas DunkerSean A   . Hypertriglyceridemia 07/11/2013  . Impaired glucose tolerance 07/11/2013  . STRABISMUS 06/30/2010   Qualifier: Diagnosis of  By: Jonny RuizJohn MD, Len BlalockJames W   . VITAMIN D DEFICIENCY 11/01/2009   Qualifier: Diagnosis of  By: Jonny RuizJohn MD, Len BlalockJames W     Family History  Problem Relation Age of Onset  . Hypertension Unknown   . Breast cancer Unknown     Past Surgical History:  Procedure Laterality Date  . right knee surgury     arthroscopy  . TONSILLECTOMY     Social History   Occupational History  . Not on file  Tobacco Use  . Smoking status: Never Smoker  . Smokeless tobacco: Never Used  Substance and  Sexual Activity  . Alcohol use: No  . Drug use: No  . Sexual activity: Not on file

## 2018-06-16 ENCOUNTER — Ambulatory Visit
Admission: RE | Admit: 2018-06-16 | Discharge: 2018-06-16 | Disposition: A | Discharge status: Home / Self Care | Payer: 59 | Source: Ambulatory Visit | Attending: Family Medicine | Admitting: Family Medicine

## 2018-06-16 DIAGNOSIS — G8929 Other chronic pain: Secondary | ICD-10-CM

## 2018-06-16 DIAGNOSIS — M25561 Pain in right knee: Principal | ICD-10-CM

## 2018-06-17 ENCOUNTER — Ambulatory Visit (INDEPENDENT_AMBULATORY_CARE_PROVIDER_SITE_OTHER): Payer: 59 | Admitting: Family Medicine

## 2018-06-17 ENCOUNTER — Telehealth (INDEPENDENT_AMBULATORY_CARE_PROVIDER_SITE_OTHER): Payer: Self-pay | Admitting: Family Medicine

## 2018-06-17 ENCOUNTER — Encounter (INDEPENDENT_AMBULATORY_CARE_PROVIDER_SITE_OTHER): Payer: Self-pay | Admitting: Family Medicine

## 2018-06-17 ENCOUNTER — Ambulatory Visit (INDEPENDENT_AMBULATORY_CARE_PROVIDER_SITE_OTHER): Payer: 59 | Admitting: Orthopedic Surgery

## 2018-06-17 DIAGNOSIS — M25562 Pain in left knee: Secondary | ICD-10-CM

## 2018-06-17 DIAGNOSIS — M1711 Unilateral primary osteoarthritis, right knee: Secondary | ICD-10-CM

## 2018-06-17 DIAGNOSIS — M25561 Pain in right knee: Principal | ICD-10-CM

## 2018-06-17 DIAGNOSIS — G8929 Other chronic pain: Secondary | ICD-10-CM | POA: Diagnosis not present

## 2018-06-17 DIAGNOSIS — M1712 Unilateral primary osteoarthritis, left knee: Secondary | ICD-10-CM

## 2018-06-17 NOTE — Telephone Encounter (Signed)
Patient advised of this at her office visit today. She chose to proceed with the Euflexxa injections.  #1 of 3 given in bilateral knees today.  Copy of MRI report of knee given to patient.

## 2018-06-17 NOTE — Progress Notes (Signed)
Subjective: She is here for planned Euflexxa injections for bilateral knee osteoarthritis.  Objective: Trace effusion in both knees, no warmth or erythema.  Procedure: Bilateral knee Euflexxa injections: After sterile prep with Betadine, injected 3 cc 1% lidocaine without epinephrine then injected Euflexxa from superolateral approach, a flash of clear yellow synovial fluid was obtained prior to each injection.  Follow-up in 1 week for the second of 3 injections.

## 2018-06-17 NOTE — Telephone Encounter (Signed)
MRI shows a lateral meniscus tear.  There is also a significant amount of arthritis in the joint.  Arthroscopic surgery might be helpful for the lateral meniscus tear, but occasionally it makes the arthritis pain worse.  I would suggest consulting with Dr. August Saucerean to discuss the possibility of arthroscopic surgery.  Another consideration would be gel injections, but these are not as effective with a torn meniscus cartilage present.

## 2018-06-26 ENCOUNTER — Encounter (INDEPENDENT_AMBULATORY_CARE_PROVIDER_SITE_OTHER): Payer: Self-pay | Admitting: Family Medicine

## 2018-06-26 ENCOUNTER — Telehealth (INDEPENDENT_AMBULATORY_CARE_PROVIDER_SITE_OTHER): Payer: Self-pay | Admitting: *Deleted

## 2018-06-26 ENCOUNTER — Encounter (INDEPENDENT_AMBULATORY_CARE_PROVIDER_SITE_OTHER): Payer: Self-pay

## 2018-06-26 ENCOUNTER — Ambulatory Visit (INDEPENDENT_AMBULATORY_CARE_PROVIDER_SITE_OTHER): Payer: 59 | Admitting: Family Medicine

## 2018-06-26 DIAGNOSIS — M1711 Unilateral primary osteoarthritis, right knee: Secondary | ICD-10-CM

## 2018-06-26 DIAGNOSIS — M1712 Unilateral primary osteoarthritis, left knee: Secondary | ICD-10-CM | POA: Diagnosis not present

## 2018-06-26 NOTE — Progress Notes (Signed)
Subjective: She is here for Euflexxa No. 2 for bilateral knee osteoarthritis.  Objective: 1+ effusion in both knees with no warmth or erythema.  Procedure: Bilateral Euflexxa injections: After sterile prep with Betadine, injected 3 cc 1% lidocaine without epinephrine and Euflexxa from superolateral approach into each knee without complication.  A flash of clear yellow synovial fluid was obtained prior to each injection.  Ultrasound was used but not recorded or charged for.  RTC 1 week.

## 2018-06-26 NOTE — Telephone Encounter (Signed)
Ok to provide note

## 2018-06-26 NOTE — Telephone Encounter (Signed)
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   Please fax note to [phone]  [facility] # [phone]

## 2018-06-26 NOTE — Telephone Encounter (Signed)
Advised patient the note was faxed.

## 2018-06-26 NOTE — Telephone Encounter (Signed)
Yes

## 2018-06-28 ENCOUNTER — Telehealth: Payer: Self-pay

## 2018-06-28 MED ORDER — AMLODIPINE BESYLATE 5 MG PO TABS: 5.0000 mg | ORAL_TABLET | Freq: Every day | ORAL | 3 refills | Status: DC

## 2018-06-28 MED ORDER — HYDROCHLOROTHIAZIDE 25 MG PO TABS: 25.0000 mg | ORAL_TABLET | Freq: Every day | ORAL | 3 refills | Status: DC

## 2018-06-28 NOTE — Telephone Encounter (Signed)
Ok, we can add the amlodipine 5 mg  - done erx

## 2018-06-28 NOTE — Addendum Note (Signed)
Addended by: [name] on: 06/28/2018 01:05 PM   Modules accepted: Orders

## 2018-06-28 NOTE — Addendum Note (Signed)
Addended by: [name] on: 06/28/2018 04:05 PM   Modules accepted: Orders

## 2018-06-28 NOTE — Telephone Encounter (Signed)
Patient is calling back to get a response concerning her questions about her bp medication.  Please let patient know as soon as possible.

## 2018-06-28 NOTE — Telephone Encounter (Signed)
After speaking with the pt she stated that she is already taking hct 25mg  as well benicar 40-12.5mg  and together they are keeping her BP under control. She wanted clarification if she could also start a calcium channel blocker and a diuretic due to the stem cell injections that she will be starting?

## 2018-06-28 NOTE — Telephone Encounter (Signed)
Ok to change to hct 25 qd, but should check BP on this in the next few weeks as this has high risk of not doing as well as the [employer]

## 2018-06-28 NOTE — Telephone Encounter (Signed)
Copied from CRM [id number]. Topic: General - Other >> Jun 27, 2018  3:51 PM Percival Spanish wrote:  Pt call to say she is going to be getting stem cell injections and this will enter act with her bp med olmesartan-hydrochlorothiazide (BENICAR HCT) 40-12.5 MG tablet  She will start the injections in 4 weeks and will need the new RX asap     Pt said she can take  calcium chanel blocker or a diaretic or HCTZ

## 2018-07-01 NOTE — Telephone Encounter (Signed)
Advised patient of dr Melvyn Novas note----she is having to stop benicar (hopefully only short term, about 2 months) for stem cell injections ---she will start amlodipine (needs calcium channel blocker so that there's no medication interference with injections)---dr [name] has already sent hydrochlorithiazide 25mg  to pharm, but patient would like to change this to 12.5mg  ---routing to dr [name], can you please send lower dosage HCT to pharmacy?  Please advise, thanks

## 2018-07-01 NOTE — Telephone Encounter (Signed)
Should be ok to simply take HALF of the 25 mg for now, and keep the whole 25 mg pills in case it needs to be increased later for blood pressure

## 2018-07-02 MED ORDER — HYDROCHLOROTHIAZIDE 12.5 MG PO CAPS: 12.5000 mg | ORAL_CAPSULE | Freq: Every day | ORAL | 3 refills | Status: DC

## 2018-07-02 NOTE — Addendum Note (Signed)
Addended by: [name] on: 07/02/2018 08:01 AM   Modules accepted: Orders

## 2018-07-02 NOTE — Telephone Encounter (Signed)
That is unusual since no one else has this problem;  I wonder if her batch is "off" somehow.  Ok to new rx

## 2018-07-02 NOTE — Telephone Encounter (Signed)
Patient states she has a hard time when scoring this pill, it's so tiny, it crumbles a lot----requesting another script for 12.5mg  dosage----rerouting to dr [name], please advise, thanks

## 2018-07-03 ENCOUNTER — Telehealth (INDEPENDENT_AMBULATORY_CARE_PROVIDER_SITE_OTHER): Payer: Self-pay

## 2018-07-03 NOTE — Telephone Encounter (Signed)
Patient would like a referral for physical therapy for bilateral knee.    Talked with patient concerning PRP injections for bilateral knee and advised her that it would be $475.00 OOP per procedure, per Reginia Naas.  Advised her that Dr. August Saucer would discuss PRP with her at her office visit on Friday, 07/05/2018.

## 2018-07-04 NOTE — Telephone Encounter (Signed)
Ok thx.

## 2018-07-04 NOTE — Telephone Encounter (Signed)
Please see below. Patient has appt scheduled with you on 07/05/2018.  PT referral can be placed then if you deem appropriate.

## 2018-07-05 ENCOUNTER — Ambulatory Visit (INDEPENDENT_AMBULATORY_CARE_PROVIDER_SITE_OTHER): Payer: 59 | Admitting: Orthopedic Surgery

## 2018-07-05 ENCOUNTER — Encounter (INDEPENDENT_AMBULATORY_CARE_PROVIDER_SITE_OTHER): Payer: Self-pay | Admitting: Orthopedic Surgery

## 2018-07-05 ENCOUNTER — Ambulatory Visit (INDEPENDENT_AMBULATORY_CARE_PROVIDER_SITE_OTHER): Payer: 59 | Admitting: Family Medicine

## 2018-07-05 ENCOUNTER — Telehealth (INDEPENDENT_AMBULATORY_CARE_PROVIDER_SITE_OTHER): Payer: Self-pay

## 2018-07-05 VITALS — Ht 65.0 in | Wt 215.0 lb

## 2018-07-05 DIAGNOSIS — M1711 Unilateral primary osteoarthritis, right knee: Secondary | ICD-10-CM | POA: Diagnosis not present

## 2018-07-05 DIAGNOSIS — M1712 Unilateral primary osteoarthritis, left knee: Secondary | ICD-10-CM

## 2018-07-05 MED ORDER — LIDOCAINE HCL 1 % IJ SOLN
5.0000 mL | INTRAMUSCULAR | Status: AC | PRN
  Administered 2018-07-05: 5 mL

## 2018-07-05 MED ORDER — SODIUM HYALURONATE (VISCOSUP) 20 MG/2ML IX SOSY
20.0000 mg | PREFILLED_SYRINGE | INTRA_ARTICULAR | Status: AC | PRN
  Administered 2018-07-05: 20 mg via INTRA_ARTICULAR

## 2018-07-05 NOTE — Telephone Encounter (Signed)
Patient was in the office today and spoke with Dr. August Saucer. She would like to proceed with the PRP injections bilateral knees. Per Dr. August Saucer would like this to be set up for next Friday. I am not sure how to set up for this. Additionally the pt needs a Donjoy lateral unloader fitted knee brace for the right I am sorry but I am not sure of this process either. Can you please help me with this.

## 2018-07-05 NOTE — Progress Notes (Signed)
   Procedure Note  Patient: Ashley Wyatt             Date of Birth: 04/20/1976           MRN: [id number]             Visit Date: 07/05/2018  Procedures: Visit Diagnoses: Unilateral primary osteoarthritis, left knee  Unilateral primary osteoarthritis, right knee  Large Joint Inj: bilateral knee on 07/05/2018 9:43 PM Indications: pain, joint swelling and diagnostic evaluation Details: 18 G 1.5 in needle, superolateral approach  Arthrogram: No  Medications (Right): 5 mL lidocaine 1 %; 20 mg Sodium Hyaluronate 20 MG/2ML Medications (Left): 5 mL lidocaine 1 %; 20 mg Sodium Hyaluronate 20 MG/2ML Outcome: tolerated well, no immediate complications Procedure, treatment alternatives, risks and benefits explained, specific risks discussed. Consent was given by the patient. Immediately prior to procedure a time out was called to verify the correct patient, procedure, equipment, support staff and site/side marked as required. Patient was prepped and draped in the usual sterile fashion.    Patient presents for bilateral Euflexxa knee injections number third injection.  I will start her in physical therapy also because she has some decreased knee flexion on the right.  She is had prior bilateral knee arthroscopy with partial lateral meniscectomy.  Arthritis is present in that right knee lateral compartment which is severe.  Also want to get her fitted for varus unloader brace and also set her up for platelet rich plasma injections per her request next Wednesday in the both knees.  She is considering going into a study at [facility] for stem cells.

## 2018-07-08 ENCOUNTER — Telehealth (INDEPENDENT_AMBULATORY_CARE_PROVIDER_SITE_OTHER): Payer: Self-pay | Admitting: Orthopedic Surgery

## 2018-07-08 NOTE — Telephone Encounter (Signed)
Patient called stating that she is suppose to have a prp injection on Wednesday, but has not heard anything from anyone and has taken off of work to have this done.  She is also inquiring about being fitted for the knee brace.  CB#[phone].  Thank you.

## 2018-07-08 NOTE — Telephone Encounter (Signed)
Please advise 

## 2018-07-08 NOTE — Telephone Encounter (Signed)
I would just call the rep and then call the rep [employer] people for the platelet rich plasma thanks

## 2018-07-08 NOTE — Telephone Encounter (Signed)
I will follow up on this.

## 2018-07-09 ENCOUNTER — Telehealth (INDEPENDENT_AMBULATORY_CARE_PROVIDER_SITE_OTHER): Payer: Self-pay

## 2018-07-09 NOTE — Telephone Encounter (Signed)
Ryan with [employer] was in the office today and I gave him the contact information for this pt for the lat unloader knee brace for the right knee. He will contact her to set up a time to come into the office and get this fitted. Just FYI still needs to have the PR injections for bilat knees approved. I do not know who to contact for this but Dr. August Saucer is wanting it to be done this Friday. I do not know how to do this so please if you can help set this up I would be thankful.

## 2018-07-09 NOTE — Telephone Encounter (Signed)
Patient called back to inquire about her appointment.  Please call pt.  Thank you.

## 2018-07-09 NOTE — Telephone Encounter (Signed)
Noted. PRP scheduled

## 2018-07-09 NOTE — Telephone Encounter (Signed)
Scheduled, advised patient cost will be $950 ($475 x 2) for PRP

## 2018-07-09 NOTE — Telephone Encounter (Signed)
Can you call this pt to sch for PRP injection and donjoy brace for this Friday?

## 2018-07-10 ENCOUNTER — Telehealth (INDEPENDENT_AMBULATORY_CARE_PROVIDER_SITE_OTHER): Payer: Self-pay | Admitting: Orthopedic Surgery

## 2018-07-10 NOTE — Telephone Encounter (Signed)
Patient called left voicemail message stating she lost her Rx for (PT) Patient asked  if Rx can be faxed to the (PT) department. The fax# is [phone]   The number to contact patient is [phone]

## 2018-07-12 ENCOUNTER — Ambulatory Visit (INDEPENDENT_AMBULATORY_CARE_PROVIDER_SITE_OTHER): Payer: 59 | Admitting: Orthopedic Surgery

## 2018-07-17 ENCOUNTER — Ambulatory Visit (INDEPENDENT_AMBULATORY_CARE_PROVIDER_SITE_OTHER): Payer: 59 | Admitting: Orthopedic Surgery

## 2018-07-23 ENCOUNTER — Telehealth (INDEPENDENT_AMBULATORY_CARE_PROVIDER_SITE_OTHER): Payer: Self-pay | Admitting: Orthopedic Surgery

## 2018-07-23 NOTE — Telephone Encounter (Signed)
Sent message to [place] about this

## 2018-07-23 NOTE — Telephone Encounter (Signed)
Patient called needing an update concerning her request for a knee brace. The number to contact patient is [phone]

## 2018-08-21 ENCOUNTER — Ambulatory Visit: Payer: Self-pay | Admitting: Internal Medicine

## 2018-08-21 NOTE — Telephone Encounter (Signed)
She called in requesting the dates and brands of the Hepatitis A and Hepatitis B vaccines she had received so far.   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   I looked in her chart and gave her the dates and brands.  She thanked me for my help.   Answer Assessment - Initial Assessment Questions 1. SYMPTOMS: "Do you have any symptoms?"     Need dates of Hep A and B vaccines given. 2. SEVERITY: If symptoms are present, ask "Are they mild, moderate or severe?"     *No Answer*  Protocols used: MEDICATION QUESTION CALL-A-AH

## 2018-08-23 ENCOUNTER — Telehealth: Payer: Self-pay | Admitting: Internal Medicine

## 2018-08-23 ENCOUNTER — Ambulatory Visit: Payer: 59

## 2018-08-23 MED ORDER — AMLODIPINE BESYLATE 10 MG PO TABS: 10.0000 mg | ORAL_TABLET | Freq: Every day | ORAL | 3 refills | Status: DC

## 2018-08-23 NOTE — Telephone Encounter (Signed)
Ok for incresaed to amlodipine 10 qd - done erx

## 2018-08-23 NOTE — Telephone Encounter (Signed)
Copied from CRM [id number]. Topic: General - Other >> Aug 23, 2018  8:35 AM Tamela Oddi wrote: Reason for CRM: Patient called to request that her BP medication, amLODipine (NORVASC) 5 MG tablet, be changed to 10 mg.  Patient stated that the 5 mg does not help her BP.  Please advise and send the 10 mg to her pharmacy.  CB# [phone]

## 2018-08-23 NOTE — Addendum Note (Signed)
Addended by: [name] on: 08/23/2018 01:34 PM   Modules accepted: Orders

## 2018-08-26 ENCOUNTER — Ambulatory Visit: Payer: 59

## 2018-08-27 ENCOUNTER — Telehealth: Payer: Self-pay

## 2018-08-27 NOTE — Telephone Encounter (Signed)
Please advise. Can this patient have immunization in office?  Copied from CRM [id number]. Topic: General - Inquiry >> Aug 27, 2018  3:14 PM Lorrine Kin, NT wrote: Reason for CRM: Patient calling and would like to know if the office has shingles injections in stock? Please advise.  CB#: [phone] >> Aug 27, 2018  3:22 PM Morphies, Hermine Messick wrote: Please confirm and if okay to schedule?

## 2018-08-28 NOTE — Telephone Encounter (Signed)
Routing to [place]on/sched---yes, we do have shingles vaccine available ---patient needs to have commercial insurance as primary insurance with us----can schedule at her earliest convenience either in nurse or office visit---I can also talk with her if there's any further questions---please schedule patient, thanks

## 2018-08-29 NOTE — Telephone Encounter (Signed)
Patient has already gotten it from the pharmacy.

## 2019-01-20 ENCOUNTER — Telehealth: Payer: Self-pay

## 2019-01-20 NOTE — Telephone Encounter (Signed)
Ok.. do you know where she had immunizations done at?

## 2019-01-20 NOTE — Telephone Encounter (Signed)
Called pt, LVM. Informed her she is up to date.   Copied from [place] [id number]. Topic: Quick Communication - See Telephone Encounter >> Jan 20, 2019 10:42 AM Parke Poisson wrote: CRM for notification. See Telephone encounter for: 01/20/19.Pt wants to know if she is up to date on Hepatitis A/B vaccine.I so she would like it entered into [facility] or called to advise

## 2019-01-20 NOTE — Telephone Encounter (Signed)
Added to [facility]../lmb 

## 2019-01-20 NOTE — Telephone Encounter (Signed)
I dont

## 2019-06-28 ENCOUNTER — Other Ambulatory Visit: Payer: Self-pay | Admitting: Internal Medicine

## 2019-06-28 NOTE — Telephone Encounter (Signed)
Please refill as per office routine med refill policy (all routine meds refilled for 3 mo or monthly per pt preference up to one year from last visit, then month to month grace period for 3 mo, then further med refills will have to be denied)  

## 2019-08-07 ENCOUNTER — Encounter: Payer: 59 | Admitting: Internal Medicine

## 2019-08-21 ENCOUNTER — Encounter: Payer: 59 | Admitting: Internal Medicine

## 2019-08-21 ENCOUNTER — Encounter: Payer: Self-pay | Admitting: Internal Medicine

## 2019-08-21 ENCOUNTER — Other Ambulatory Visit: Payer: Self-pay | Admitting: Internal Medicine

## 2019-08-21 ENCOUNTER — Ambulatory Visit (INDEPENDENT_AMBULATORY_CARE_PROVIDER_SITE_OTHER): Payer: 59 | Admitting: Internal Medicine

## 2019-08-21 ENCOUNTER — Other Ambulatory Visit: Payer: Self-pay

## 2019-08-21 VITALS — BP 110/66 | HR 74 | Temp 98.7°F | Ht <= 58 in | Wt 221.8 lb

## 2019-08-21 DIAGNOSIS — E538 Deficiency of other specified B group vitamins: Secondary | ICD-10-CM | POA: Diagnosis not present

## 2019-08-21 DIAGNOSIS — L309 Dermatitis, unspecified: Secondary | ICD-10-CM | POA: Diagnosis not present

## 2019-08-21 DIAGNOSIS — E559 Vitamin D deficiency, unspecified: Secondary | ICD-10-CM

## 2019-08-21 DIAGNOSIS — Z0001 Encounter for general adult medical examination with abnormal findings: Secondary | ICD-10-CM

## 2019-08-21 DIAGNOSIS — R7302 Impaired glucose tolerance (oral): Secondary | ICD-10-CM | POA: Diagnosis not present

## 2019-08-21 DIAGNOSIS — I10 Essential (primary) hypertension: Secondary | ICD-10-CM | POA: Diagnosis not present

## 2019-08-21 DIAGNOSIS — D509 Iron deficiency anemia, unspecified: Secondary | ICD-10-CM

## 2019-08-21 LAB — BASIC METABOLIC PANEL
BUN: 7 mg/dL (ref 6–23)
CO2: 30 mEq/L (ref 19–32)
Calcium: 8.9 mg/dL (ref 8.4–10.5)
Chloride: 102 mEq/L (ref 96–112)
Creatinine, Ser: 0.51 mg/dL (ref 0.40–1.20)
GFR: 130.32 mL/min (ref >60.00)
Glucose, Bld: 90 mg/dL (ref 70–99)
Potassium: 3.7 mEq/L (ref 3.5–5.1)
Sodium: 137 mEq/L (ref 135–145)

## 2019-08-21 LAB — URINALYSIS, ROUTINE W REFLEX MICROSCOPIC
Bilirubin Urine: NEGATIVE
Hgb urine dipstick: NEGATIVE
Ketones, ur: NEGATIVE
Leukocytes,Ua: NEGATIVE
Nitrite: NEGATIVE
Specific Gravity, Urine: 1.02 (ref 1.000–1.030)
Total Protein, Urine: NEGATIVE
Urine Glucose: NEGATIVE
Urobilinogen, UA: 0.2 (ref 0.0–1.0)
pH: 7 (ref 5.0–8.0)

## 2019-08-21 LAB — CBC WITH DIFFERENTIAL/PLATELET
Basophils Absolute: 0 10*3/uL (ref 0.0–0.1)
Basophils Relative: 0.4 % (ref 0.0–3.0)
Eosinophils Absolute: 0.2 10*3/uL (ref 0.0–0.7)
Eosinophils Relative: 2.3 % (ref 0.0–5.0)
HCT: 40.5 % (ref 36.0–46.0)
Hemoglobin: 13.5 g/dL (ref 12.0–15.0)
Lymphocytes Relative: 24.6 % (ref 12.0–46.0)
Lymphs Abs: 2.4 10*3/uL (ref 0.7–4.0)
MCHC: 33.3 g/dL (ref 30.0–36.0)
MCV: 87.7 fl (ref 78.0–100.0)
Monocytes Absolute: 0.5 10*3/uL (ref 0.1–1.0)
Monocytes Relative: 5.3 % (ref 3.0–12.0)
Neutro Abs: 6.6 10*3/uL (ref 1.4–7.7)
Neutrophils Relative %: 67.4 % (ref 43.0–77.0)
Platelets: 250 10*3/uL (ref 150.0–400.0)
RBC: 4.61 Mil/uL (ref 3.87–5.11)
RDW: 13.7 % (ref 11.5–15.5)
WBC: 9.7 10*3/uL (ref 4.0–10.5)

## 2019-08-21 LAB — HEPATIC FUNCTION PANEL
ALT: 13 U/L (ref 0–35)
AST: 13 U/L (ref 0–37)
Albumin: 3.9 g/dL (ref 3.5–5.2)
Alkaline Phosphatase: 71 U/L (ref 39–117)
Bilirubin, Direct: 0.1 mg/dL (ref 0.0–0.3)
Total Bilirubin: 0.3 mg/dL (ref 0.2–1.2)
Total Protein: 6.7 g/dL (ref 6.0–8.3)

## 2019-08-21 LAB — LIPID PANEL
Cholesterol: 160 mg/dL (ref 0–200)
HDL: 47.2 mg/dL (ref >39.00)
LDL Cholesterol: 86 mg/dL (ref 0–99)
NonHDL: 113.26
Total CHOL/HDL Ratio: 3
Triglycerides: 138 mg/dL (ref 0.0–149.0)
VLDL: 27.6 mg/dL (ref 0.0–40.0)

## 2019-08-21 LAB — IBC PANEL
Iron: 50 ug/dL (ref 42–145)
Saturation Ratios: 11.8 % — ABNORMAL LOW (ref 20.0–50.0)
Transferrin: 303 mg/dL (ref 212.0–360.0)

## 2019-08-21 LAB — HEMOGLOBIN A1C: Hgb A1c MFr Bld: 5.6 % (ref 4.6–6.5)

## 2019-08-21 LAB — VITAMIN D 25 HYDROXY (VIT D DEFICIENCY, FRACTURES): VITD: 26.56 ng/mL — ABNORMAL LOW (ref 30.00–100.00)

## 2019-08-21 LAB — VITAMIN B12: Vitamin B-12: 444 pg/mL (ref 211–911)

## 2019-08-21 LAB — TSH: TSH: 1.05 u[IU]/mL (ref 0.35–4.50)

## 2019-08-21 MED ORDER — PHENTERMINE HCL 37.5 MG PO CAPS: 37.5000 mg | ORAL_CAPSULE | ORAL | 2 refills | Status: DC

## 2019-08-21 MED ORDER — CLOTRIMAZOLE-BETAMETHASONE 1-0.05 % EX CREA: 1.0000 "application " | TOPICAL_CREAM | Freq: Two times a day (BID) | CUTANEOUS | 5 refills | Status: DC

## 2019-08-21 MED ORDER — AMLODIPINE BESYLATE 5 MG PO TABS: 5.0000 mg | ORAL_TABLET | Freq: Two times a day (BID) | ORAL | 3 refills | Status: DC

## 2019-08-21 MED ORDER — CLOBETASOL PROPIONATE 0.05 % EX CREA: 1.0000 "application " | TOPICAL_CREAM | Freq: Two times a day (BID) | CUTANEOUS | 5 refills | Status: DC

## 2019-08-21 MED ORDER — VITAMIN D (ERGOCALCIFEROL) 1.25 MG (50000 UNIT) PO CAPS: 50000.0000 [IU] | ORAL_CAPSULE | ORAL | 0 refills | Status: DC

## 2019-08-21 NOTE — Progress Notes (Signed)
Subjective:    Patient ID: Ashley Wyatt, female    DOB: July 14, 1975, 44 y.o.   MRN: [id number]  HPI  Here for wellness and f/u;  Overall doing ok;  Pt denies Chest pain, worsening SOB, DOE, wheezing, orthopnea, PND, worsening LE edema, palpitations, dizziness or syncope.  Pt denies neurological change such as new headache, facial or extremity weakness.  Pt denies polydipsia, polyuria, or low sugar symptoms. Pt states overall good compliance with treatment and medications, good tolerability, and has been trying to follow appropriate diet.  Pt denies worsening depressive symptoms, suicidal ideation or panic. No fever, night sweats, wt loss, loss of appetite, or other constitutional symptoms.  Pt states good ability with ADL's, has low fall risk, home safety reviewed and adequate, no other significant changes in hearing or vision, and only occasionally active with exercise. 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  Asks for phentermine for further wt loss.  Eczema acting up, asks for temovate.   Past Medical History:  Diagnosis Date  . ALLERGIC RHINITIS 08/20/2008   Qualifier: Diagnosis of  By: Loanne Drilling MD, Jacelyn Pi   . ANEMIA-IRON DEFICIENCY 11/01/2009   Qualifier: Diagnosis of  By: Jenny Reichmann MD, Hunt Oris   . DIABETES MELLITUS, GESTATIONAL, HX OF 11/01/2009   Qualifier: Diagnosis of  By: Jenny Reichmann MD, [place] 08/20/2008   Qualifier: Diagnosis of  By: Loanne Drilling MD, Jacelyn Pi HYPERCHOLESTEROLEMIA 08/20/2008   Qualifier: Diagnosis of  By: Loanne Drilling MD, Jacelyn Pi   . HYPERTENSION 02/11/2007   Qualifier: Diagnosis of  By: Loanne Drilling MD, Jacelyn Pi   . Hypertriglyceridemia 07/11/2013  . Impaired glucose tolerance 07/11/2013  . STRABISMUS 06/30/2010   Qualifier: Diagnosis of  By: Jenny Reichmann MD, Hunt Oris   . VITAMIN D DEFICIENCY 11/01/2009   Qualifier: Diagnosis of  By: Jenny Reichmann MD, Hunt Oris    Past Surgical History:    Procedure Laterality Date  . right knee surgury     arthroscopy  . TONSILLECTOMY      reports that she has never smoked. She has never used smokeless tobacco. She reports that she does not drink alcohol or use drugs. family history includes Breast cancer in an other family member; Hypertension in an other family member. No Known Allergies Current Outpatient Medications on File Prior to Visit  Medication Sig Dispense Refill  . Colloidal Oatmeal (ECZEMA MOISTURIZING EX) Apply topically.    Marland Kitchen omeprazole (PRILOSEC) 20 MG capsule Take by mouth.     No current facility-administered medications on file prior to visit.   Review of Systems All otherwise neg per pt     Objective:   Physical Exam BP 110/66   Pulse 74   Temp 98.7 F (37.1 C)   Ht 1' (0.305 m)   Wt 221 lb 12.8 oz (100.6 kg)   BMI 1082.93 kg/m  VS noted,  Constitutional: Pt appears in NAD HENT: Head: NCAT.  Right Ear: External ear normal.  Left Ear: External ear normal.  Eyes: . Pupils are equal, round, and reactive to light. Conjunctivae and EOM are normal Nose: without d/c or deformity Neck: Neck supple. Gross normal ROM Cardiovascular: Normal rate and regular rhythm.   Pulmonary/Chest: Effort normal and breath sounds without rales or wheezing.  Abd:  Soft, NT, ND, + BS, no organomegaly Neurological: Pt is alert. At  baseline orientation, motor grossly intact Skin: Skin is warm. No rashes, other new lesions, no LE edema Psychiatric: Pt behavior is normal without agitation  All otherwise neg per pt Lab Results  Component Value Date   WBC 9.7 08/21/2019   HGB 13.5 08/21/2019   HCT 40.5 08/21/2019   PLT 250.0 08/21/2019   GLUCOSE 90 08/21/2019   CHOL 160 08/21/2019   TRIG 138.0 08/21/2019   HDL 47.20 08/21/2019   LDLDIRECT 81.0 08/03/2017   LDLCALC 86 08/21/2019   ALT 13 08/21/2019   AST 13 08/21/2019   NA 137 08/21/2019   K 3.7 08/21/2019   CL 102 08/21/2019   CREATININE 0.51 08/21/2019   BUN 7  08/21/2019   CO2 30 08/21/2019   TSH 1.05 08/21/2019   HGBA1C 5.6 08/21/2019      Assessment & Plan:

## 2019-08-21 NOTE — Patient Instructions (Signed)
Please take all new medication as prescribed - the phentermine  Ok to take the amlodipine at 5 mg twice per day  Please continue all other medications as before, and refills have been done if requested.  Please have the pharmacy call with any other refills you may need.  Please continue your efforts at being more active, low cholesterol diet, and weight control.  You are otherwise up to date with prevention measures today.  Please keep your appointments with your specialists as you may have planned  Please go to the LAB at the blood drawing area for the tests to be done  You will be contacted by phone if any changes need to be made immediately.  Otherwise, you will receive a letter about your results with an explanation, but please check with MyChart first.  Please remember to sign up for MyChart if you have not done so, as this will be important to you in the future with finding out test results, communicating by private email, and scheduling acute appointments online when needed.  Please make an Appointment to return for your 1 year visit, or sooner if needed

## 2019-08-23 ENCOUNTER — Encounter: Payer: Self-pay | Admitting: Internal Medicine

## 2019-08-23 NOTE — Assessment & Plan Note (Signed)
For replacement 

## 2019-08-23 NOTE — Assessment & Plan Note (Signed)

## 2019-08-23 NOTE — Assessment & Plan Note (Signed)
Ok for phentermine asd,  to f/u any worsening symptoms or concerns °

## 2019-08-23 NOTE — Assessment & Plan Note (Signed)
Ok for amlodipine 5 bid

## 2019-08-23 NOTE — Assessment & Plan Note (Addendum)
Ok for temovate asd,  to f/u any worsening symptoms or concerns  I spent 21 minutes in preparing to see the patient by review of recent labs, imaging and procedures, obtaining and reviewing separately obtained history, communicating with the patient and family or caregiver, ordering medications, tests or procedures, and documenting clinical information in the EHR including the differential Dx, treatment, and any further evaluation and other management of eczema, obesity, HTN, iron deficiency, vit d deficiency,  hyperglycemia

## 2019-08-23 NOTE — Assessment & Plan Note (Signed)
stable overall by history and exam, recent data reviewed with pt, and pt to continue medical treatment as before,  to f/u any worsening symptoms or concerns  

## 2019-08-23 NOTE — Assessment & Plan Note (Signed)
For iron recheck,  to f/u any worsening symptoms or concerns

## 2019-10-03 ENCOUNTER — Telehealth: Payer: Self-pay | Admitting: Internal Medicine

## 2019-10-03 NOTE — Telephone Encounter (Signed)
New message:   Pt is calling and states that this medication phentermine 37.5 MG capsule is supposed to be sent in tablets for her to [facility] [address] [place] - [address] Pt states to please call her when the prescription has been sent.

## 2019-10-04 MED ORDER — PHENTERMINE HCL 37.5 MG PO TABS: 37.5000 mg | ORAL_TABLET | Freq: Every day | ORAL | 2 refills | Status: DC

## 2019-10-04 NOTE — Telephone Encounter (Signed)
Done erx 

## 2019-11-14 ENCOUNTER — Other Ambulatory Visit: Payer: Self-pay | Admitting: Internal Medicine

## 2019-11-14 NOTE — Telephone Encounter (Signed)
Please refill as per office routine med refill policy (all routine meds refilled for 3 mo or monthly per pt preference up to one year from last visit, then month to month grace period for 3 mo, then further med refills will have to be denied)  

## 2019-11-14 NOTE — Telephone Encounter (Signed)
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  Please call pt to ask if she meant to ask for refill, as this was d/c in mar 2021 by our record

## 2019-11-18 NOTE — Telephone Encounter (Signed)
Called pt to clarify & pt states she forgot that Dr [name] changed her prescription to 5mg  & request a decline on this Amlodpine 10mg .

## 2020-01-22 ENCOUNTER — Telehealth: Payer: Self-pay | Admitting: Orthopedic Surgery

## 2020-01-22 NOTE — Telephone Encounter (Signed)
Patient called asked if she can be worked into Dr [employer] schedule for an earlier [employer] for her right hand. The number to contact patient is [phone]

## 2020-01-23 NOTE — Telephone Encounter (Signed)
IC appt scheduled.  

## 2020-01-28 ENCOUNTER — Ambulatory Visit: Payer: 59 | Admitting: Family Medicine

## 2020-01-28 ENCOUNTER — Other Ambulatory Visit: Payer: Self-pay

## 2020-01-28 ENCOUNTER — Encounter: Payer: Self-pay | Admitting: Family Medicine

## 2020-01-28 ENCOUNTER — Ambulatory Visit: Payer: Self-pay

## 2020-01-28 DIAGNOSIS — M1711 Unilateral primary osteoarthritis, right knee: Secondary | ICD-10-CM

## 2020-01-28 DIAGNOSIS — M5441 Lumbago with sciatica, right side: Secondary | ICD-10-CM

## 2020-01-28 DIAGNOSIS — M1712 Unilateral primary osteoarthritis, left knee: Secondary | ICD-10-CM | POA: Diagnosis not present

## 2020-01-28 DIAGNOSIS — M25531 Pain in right wrist: Secondary | ICD-10-CM | POA: Diagnosis not present

## 2020-01-28 DIAGNOSIS — G8929 Other chronic pain: Secondary | ICD-10-CM

## 2020-01-28 NOTE — Progress Notes (Signed)
Office Visit Note   Patient: Ashley Wyatt           Date of Birth: [birth year]           MRN: [id number] Visit Date: 01/28/2020 Requested by: [name], MD [address] [place],  [place] [address] PCP: [name], MD  Subjective: Chief Complaint  Patient presents with  . Right Wrist - Pain    Fell with outstretched hand 2 weeks ago. Still has pain in the wrist, with some swelling. Types all day at work - aggravates the wrist. Right-hand dominant. Requests Rx for PT for wrist, knees and sciatica.    HPI: She is here with right wrist pain.  2 weeks ago she fell catching herself with her outstretched wrist.  Immediate pain on the radial aspect.  If he has improved, but is still sore when she moves it especially with dorsiflexion.  Last year she had stem cell injections in her spine and her knees.  She feels like it helped, but now those areas are starting to hurt again.  She would like referral to physical therapy for all of these areas.               ROS:   All other systems were reviewed and are negative.  Objective: Vital Signs: There were no vitals taken for this visit.  Physical Exam:  General:  Alert and oriented, in no acute distress. Pulm:  Breathing unlabored. Psy:  Normal mood, congruent affect. Skin: No bruising Right wrist: Full range of motion compared to left.  Negative Finkelstein test.  She is tender in the anatomic snuffbox.  No pain with resisted strength testing.  Imaging: XR Wrist Complete Right  Result Date: 01/28/2020 X-rays of the right wrist reveal anatomic alignment with no definite fracture.  No significant arthritic change.   Assessment & Plan: 1.  Right wrist pain 2-week status post fall, suspect sprain but cannot rule out occult scaphoid fracture. -She will wear an over-the-counter wrist brace, she will notify me in 2 weeks if she is still hurting.  At that point we will repeat AP, lateral, and scaphoid view x-rays.  If still unremarkable  we may order MRI scan.  2.  Chronic bilateral knee pain and low back pain -Physical therapy referral.     Procedures: No procedures performed  No notes on file     PMFS History: Patient Active Problem List   Diagnosis Date Noted  . Ganglion cyst 08/04/2017  . Ear mass, right 08/04/2017  . Rash 08/03/2017  . Acne 08/03/2017  . Hypersomnolence 12/01/2016  . Paresthesias 11/28/2016  . GERD (gastroesophageal reflux disease) 06/06/2016  . Impaired glucose tolerance 07/11/2013  . Hypertriglyceridemia 07/11/2013  . Obesity 07/11/2013  . Polyarthralgia 07/04/2013  . Encounter for well adult exam with abnormal findings 11/18/2011  . STRABISMUS 06/30/2010  . Vitamin D deficiency 11/01/2009  . ANEMIA-IRON DEFICIENCY 11/01/2009  . URTICARIA 11/01/2009  . DIABETES MELLITUS, GESTATIONAL, HX OF 11/01/2009  . HYPERCHOLESTEROLEMIA 08/20/2008  . ALLERGIC RHINITIS 08/20/2008  . Eczema 08/20/2008  . FATIGUE 08/20/2008  . Essential hypertension 02/11/2007   Past Medical History:  Diagnosis Date  . ALLERGIC RHINITIS 08/20/2008   Qualifier: Diagnosis of  By: Everardo All MD, Cleophas Dunker   . ANEMIA-IRON DEFICIENCY 11/01/2009   Qualifier: Diagnosis of  By: [name] MD, Len Blalock   . DIABETES MELLITUS, GESTATIONAL, HX OF 11/01/2009   Qualifier: Diagnosis of  By: [name] MD, Len Blalock   . ECZEMA  08/20/2008   Qualifier: Diagnosis of  By: Everardo All MD, Cleophas Dunker   . HYPERCHOLESTEROLEMIA 08/20/2008   Qualifier: Diagnosis of  By: Everardo All MD, Cleophas Dunker   . HYPERTENSION 02/11/2007   Qualifier: Diagnosis of  By: Everardo All MD, Cleophas Dunker   . Hypertriglyceridemia 07/11/2013  . Impaired glucose tolerance 07/11/2013  . STRABISMUS 06/30/2010   Qualifier: Diagnosis of  By: [name] MD, Len Blalock   . VITAMIN D DEFICIENCY 11/01/2009   Qualifier: Diagnosis of  By: [name] MD, Len Blalock     Family History  Problem Relation Age of Onset  . Hypertension Other   . Breast cancer Other     Past Surgical History:  Procedure Laterality Date  . right knee  surgury     arthroscopy  . TONSILLECTOMY     Social History   Occupational History  . Not on file  Tobacco Use  . Smoking status: Never Smoker  . Smokeless tobacco: Never Used  Substance and Sexual Activity  . Alcohol use: No  . Drug use: No  . Sexual activity: Not on file

## 2020-03-15 ENCOUNTER — Other Ambulatory Visit: Payer: Self-pay | Admitting: Neurology

## 2020-03-15 DIAGNOSIS — R519 Headache, unspecified: Secondary | ICD-10-CM

## 2020-03-15 IMAGING — MR MR KNEE*R* W/O CM
4 of 7 series · 23 of 40 positions shown · non-contrast
Comparison: None.

CLINICAL DATA: Chronic medial knee pain and swelling. No specific
injury.

EXAM:
MRI OF THE RIGHT KNEE WITHOUT CONTRAST
TECHNIQUE: Multiplanar, multisequence MR imaging of the knee was performed. No
intravenous contrast was administered.

[Series 5: PD fat-sat · sagittal · 3.0mm · 0.29mm/px · 7 of 35 slices shown]
[im 1/35]
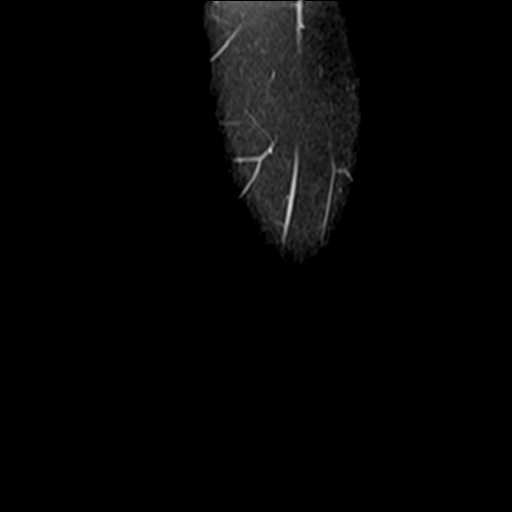
[im 6/35]
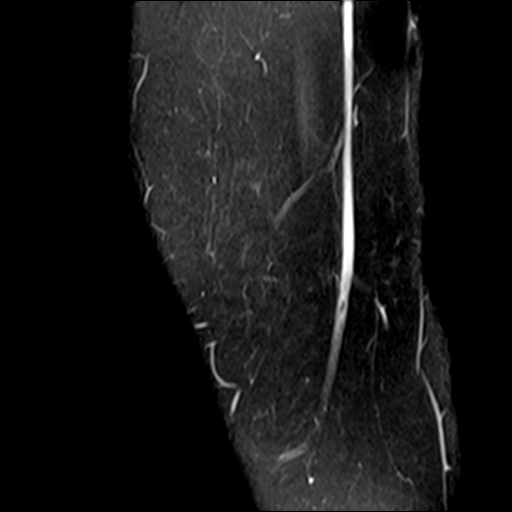
[im 12/35]
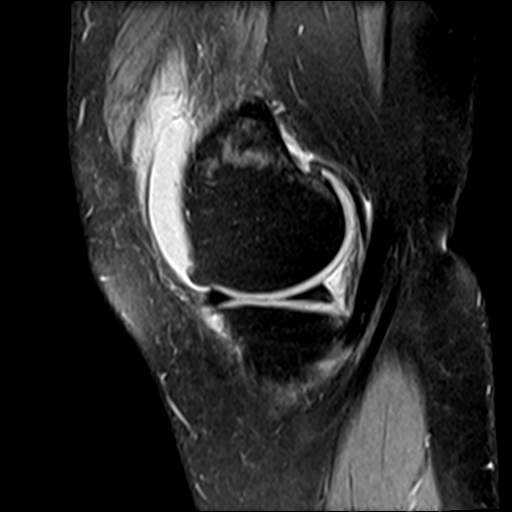
[im 18/35]
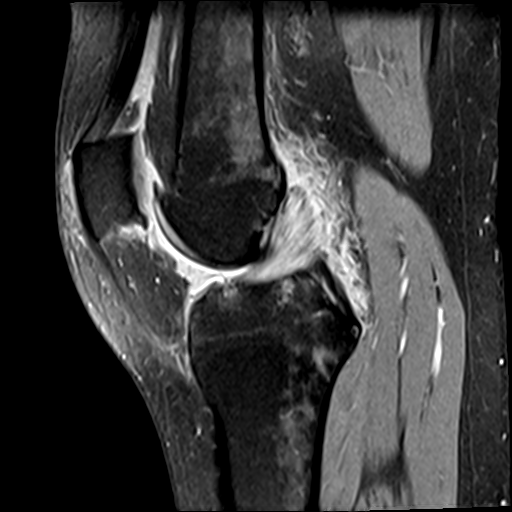
[im 23/35]
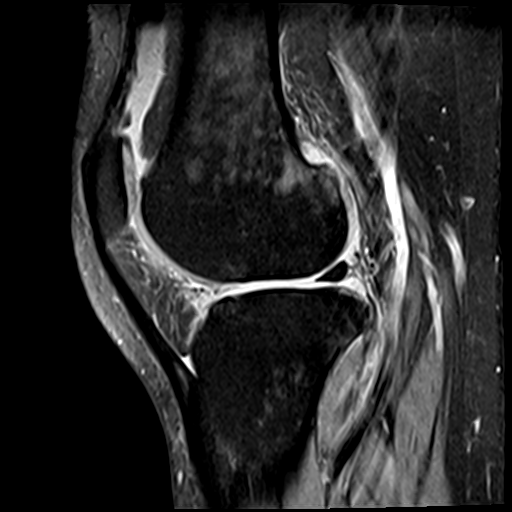
[im 29/35]
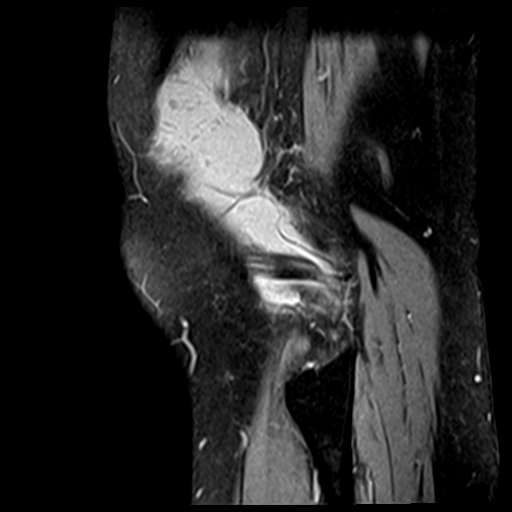
[im 35/35]
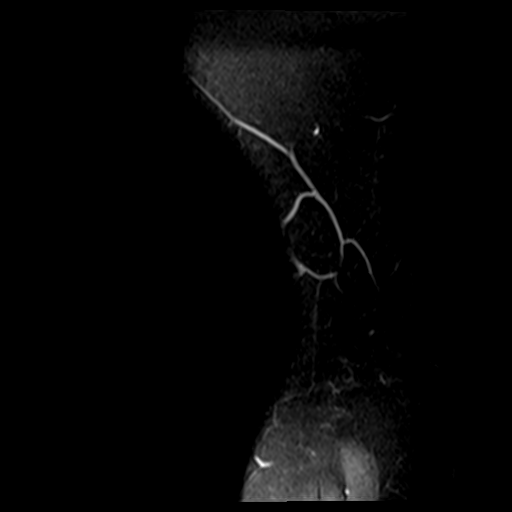

[Series 6: T1 · coronal · 4.0mm · 0.29mm/px · 4 of 33 slices shown]
[im 1/33]
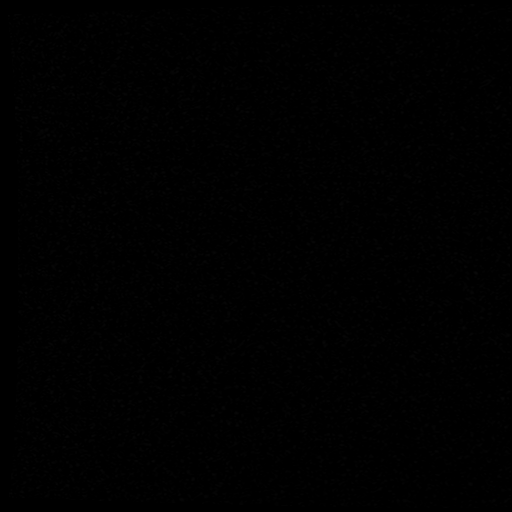
[im 7/33]
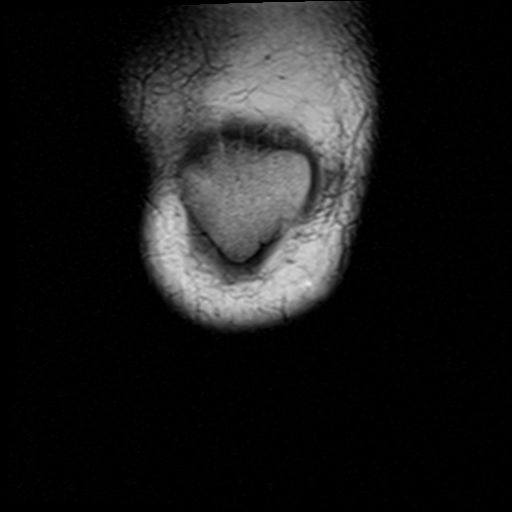
[im 20/33]
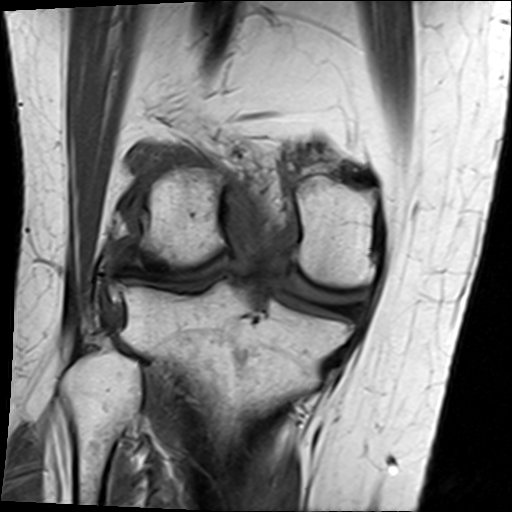
[im 33/33]
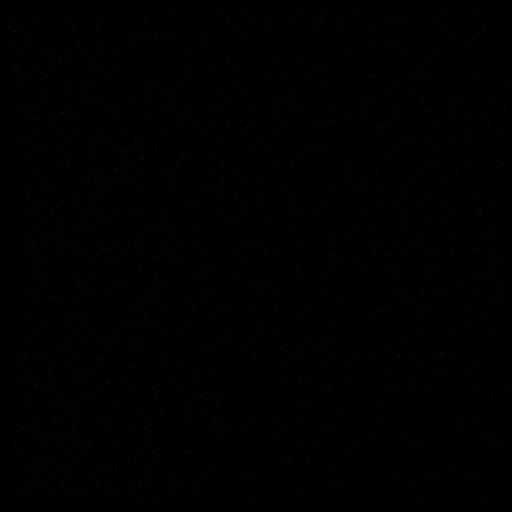

[Series 7: T2 fat-sat · coronal · 4.0mm · 0.59mm/px · 6 of 29 slices shown (1 of 2)]
[im 1/29]
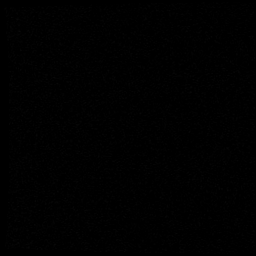
[im 6/29]
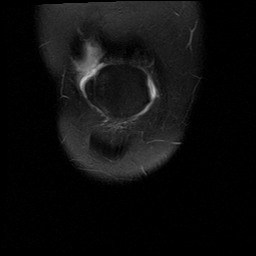
[im 12/29]
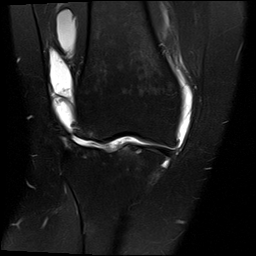
[im 17/29]
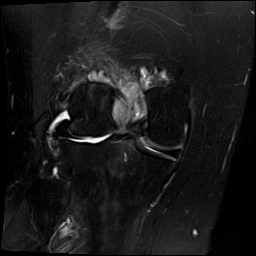
[im 23/29]
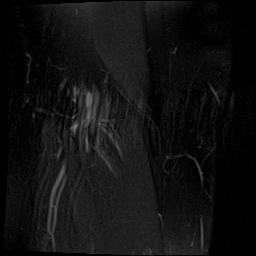
[im 29/29]
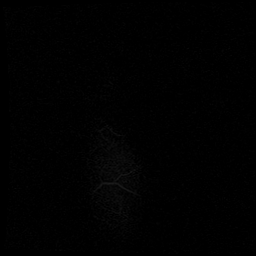

[Series 9: T2 fat-sat · sagittal · 3.0mm · 0.29mm/px · 6 of 34 slices shown (2 of 2)]
[im 1/34]
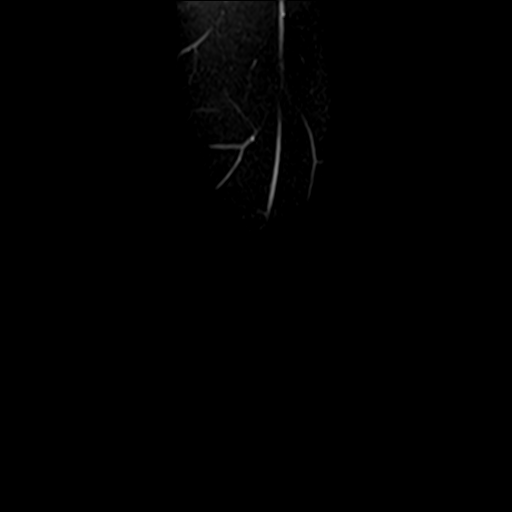
[im 7/34]
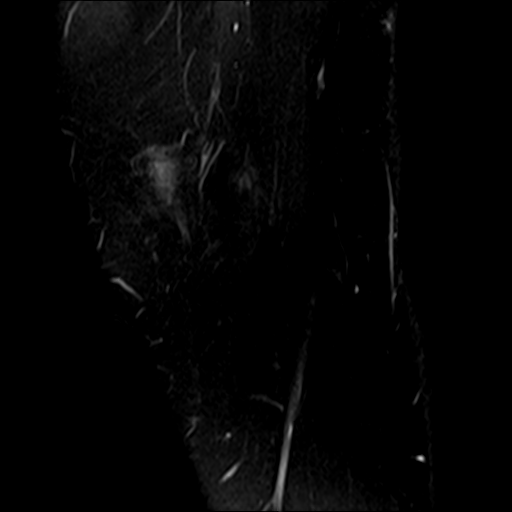
[im 14/34]
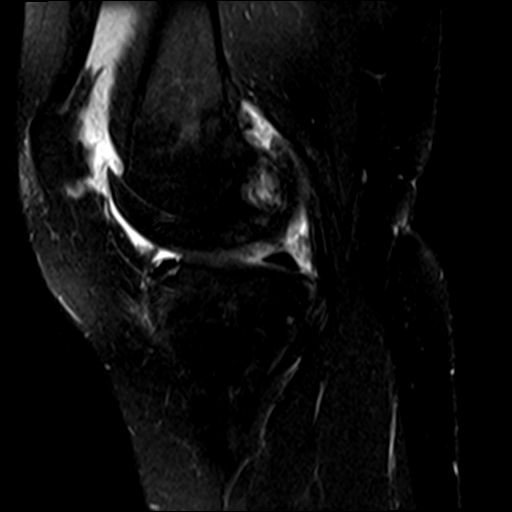
[im 20/34]
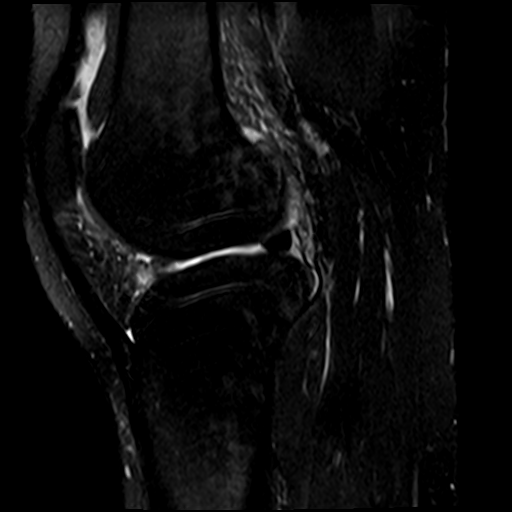
[im 27/34]
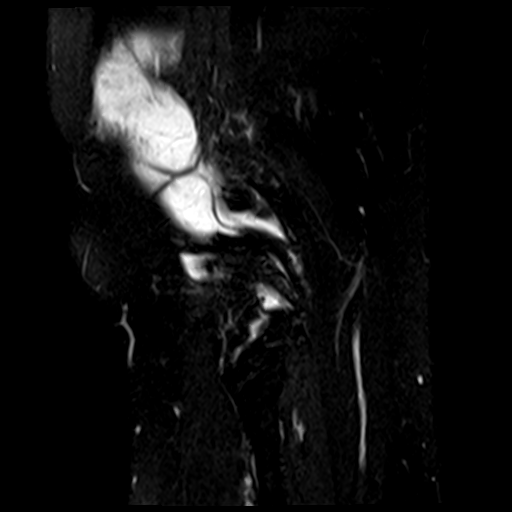
[im 34/34]
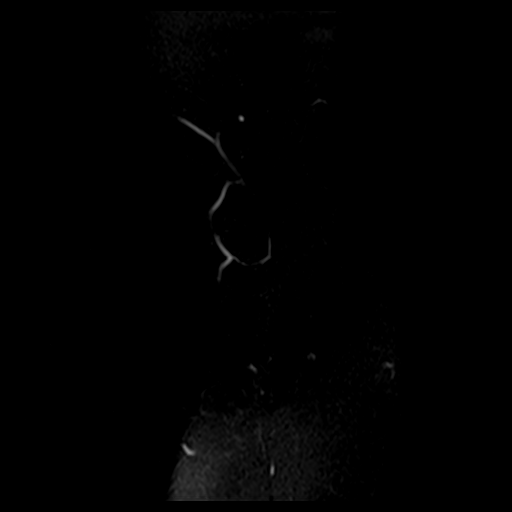

[23 of 40 positions shown; findings below may reference images not displayed]

FINDINGS: Exam limited by patient motion.

MENISCI

Medial meniscus:  Intact.

Lateral meniscus: Anterior horn tear with probable detachment and
significant lateral protrusion of the degenerated meniscus.

LIGAMENTS

Cruciates: Advanced mucoid degeneration of the ACL. The PCL is
intact.

Collaterals:  Intact

CARTILAGE

Patellofemoral: Moderate degenerative changes with cartilage
thinning and fraying. Early lateral compartment spurring.

Medial: Moderate degenerative chondrosis with early joint space
narrowing and spurring.

Lateral: Advanced degenerative chondrosis with areas of
full-thickness cartilage loss, joint space narrowing and osteophytic
spurring

Joint:  Moderate to large joint effusion mild to moderate synovitis.

Popliteal Fossa:  No popliteal mass or Baker's cyst.

Extensor Mechanism: The patella retinacular structures are intact
and the quadriceps and patellar tendons are intact. Mild patellar
tendinopathy.

Bones:  No acute bony findings.

Other: Normal knee musculature.
IMPRESSION: 1. Age advanced tricompartmental degenerative changes most
significant in the lateral compartment.
2. Anterior horn lateral meniscus tear with detachment and lateral
protrusion of the meniscus.
3. Intact ligamentous structures. Advanced mucoid degeneration of
the ACL is noted.
4. Moderate to large joint effusion and mild to moderate synovitis.

## 2020-03-21 ENCOUNTER — Other Ambulatory Visit: Payer: Self-pay

## 2020-03-21 ENCOUNTER — Emergency Department (HOSPITAL_COMMUNITY)
Admission: EM | Admit: 2020-03-21 | Discharge: 2020-03-21 | Disposition: A | Discharge status: Home / Self Care | Payer: No Typology Code available for payment source | Attending: Student | Admitting: Student

## 2020-03-21 DIAGNOSIS — Z7721 Contact with and (suspected) exposure to potentially hazardous body fluids: Secondary | ICD-10-CM | POA: Diagnosis present

## 2020-03-21 DIAGNOSIS — Z5321 Procedure and treatment not carried out due to patient leaving prior to being seen by health care provider: Secondary | ICD-10-CM | POA: Insufficient documentation

## 2020-03-21 LAB — HEPATITIS B SURFACE ANTIGEN: Hepatitis B Surface Ag: NONREACTIVE

## 2020-03-21 LAB — COMPREHENSIVE METABOLIC PANEL
ALT: 19 U/L (ref 0–44)
AST: 18 U/L (ref 15–41)
Albumin: 4.2 g/dL (ref 3.5–5.0)
Alkaline Phosphatase: 76 U/L (ref 38–126)
Anion gap: 11 (ref 5–15)
BUN: 15 mg/dL (ref 6–20)
CO2: 28 mmol/L (ref 22–32)
Calcium: 9.5 mg/dL (ref 8.9–10.3)
Chloride: 99 mmol/L (ref 98–111)
Creatinine, Ser: 0.56 mg/dL (ref 0.44–1.00)
GFR calc Af Amer: >60 mL/min (ref >60)
GFR calc non Af Amer: >60 mL/min (ref >60)
Glucose, Bld: 98 mg/dL (ref 70–99)
Potassium: 3.5 mmol/L (ref 3.5–5.1)
Sodium: 138 mmol/L (ref 135–145)
Total Bilirubin: 0.8 mg/dL (ref 0.3–1.2)
Total Protein: 7.2 g/dL (ref 6.5–8.1)

## 2020-03-21 LAB — RAPID HIV SCREEN (HIV 1/2 AB+AG)
HIV 1/2 Antibodies: NONREACTIVE
HIV-1 P24 Antigen - HIV24: NONREACTIVE

## 2020-03-21 NOTE — ED Notes (Signed)
Pt does not want to talk with provider.  States she will look up on Mychart and have discussion with employer.

## 2020-03-21 NOTE — ED Triage Notes (Signed)
Pt is pharmacist at [employer], [address], [place].  Pt was closing sharps container, got stuck in left thumb by needle sticking out of container.   Wants lab testing done only.  Discussed with Daphine Deutscher PA, lab orders obtained.

## 2020-03-22 LAB — RPR: RPR Ser Ql: NONREACTIVE

## 2020-03-22 LAB — HEPATITIS C ANTIBODY: HCV Ab: NONREACTIVE

## 2020-04-09 ENCOUNTER — Other Ambulatory Visit: Payer: 59

## 2020-05-03 ENCOUNTER — Ambulatory Visit
Admission: RE | Admit: 2020-05-03 | Discharge: 2020-05-03 | Disposition: A | Discharge status: Home / Self Care | Payer: 59 | Source: Ambulatory Visit | Attending: Neurology | Admitting: Neurology

## 2020-05-03 ENCOUNTER — Other Ambulatory Visit: Payer: Self-pay

## 2020-05-03 DIAGNOSIS — R519 Headache, unspecified: Secondary | ICD-10-CM

## 2020-05-03 MED ORDER — GADOBENATE DIMEGLUMINE 529 MG/ML IV SOLN
19.0000 mL | Freq: Once | INTRAVENOUS | Status: AC | PRN
  Administered 2020-05-03: 19 mL via INTRAVENOUS

## 2020-06-03 ENCOUNTER — Other Ambulatory Visit: Payer: Self-pay | Admitting: Internal Medicine

## 2020-06-04 NOTE — Telephone Encounter (Signed)
Please take OTC Vitamin D3 at 2000 units per day, indefinitely.  

## 2020-07-29 ENCOUNTER — Other Ambulatory Visit: Payer: Self-pay | Admitting: Internal Medicine

## 2020-08-06 ENCOUNTER — Other Ambulatory Visit: Payer: Self-pay | Admitting: Internal Medicine

## 2020-08-06 NOTE — Telephone Encounter (Signed)
Please refill as per office routine med refill policy (all routine meds refilled for 3 mo or monthly per pt preference up to one year from last visit, then month to month grace period for 3 mo, then further med refills will have to be denied)  

## 2020-09-24 ENCOUNTER — Encounter: Payer: Self-pay | Admitting: Internal Medicine

## 2020-09-24 ENCOUNTER — Ambulatory Visit (INDEPENDENT_AMBULATORY_CARE_PROVIDER_SITE_OTHER): Payer: Managed Care, Other (non HMO) | Admitting: Internal Medicine

## 2020-09-24 ENCOUNTER — Other Ambulatory Visit: Payer: Self-pay | Admitting: Internal Medicine

## 2020-09-24 ENCOUNTER — Other Ambulatory Visit: Payer: Self-pay

## 2020-09-24 VITALS — BP 138/84 | HR 75 | Temp 97.5°F | Ht 65.0 in | Wt 215.8 lb

## 2020-09-24 DIAGNOSIS — G8929 Other chronic pain: Secondary | ICD-10-CM

## 2020-09-24 DIAGNOSIS — Z0000 Encounter for general adult medical examination without abnormal findings: Secondary | ICD-10-CM

## 2020-09-24 DIAGNOSIS — R7302 Impaired glucose tolerance (oral): Secondary | ICD-10-CM

## 2020-09-24 DIAGNOSIS — I10 Essential (primary) hypertension: Secondary | ICD-10-CM

## 2020-09-24 DIAGNOSIS — M25561 Pain in right knee: Secondary | ICD-10-CM | POA: Diagnosis not present

## 2020-09-24 DIAGNOSIS — D509 Iron deficiency anemia, unspecified: Secondary | ICD-10-CM

## 2020-09-24 DIAGNOSIS — Z0001 Encounter for general adult medical examination with abnormal findings: Secondary | ICD-10-CM

## 2020-09-24 DIAGNOSIS — Z1211 Encounter for screening for malignant neoplasm of colon: Secondary | ICD-10-CM

## 2020-09-24 DIAGNOSIS — E538 Deficiency of other specified B group vitamins: Secondary | ICD-10-CM | POA: Diagnosis not present

## 2020-09-24 DIAGNOSIS — E559 Vitamin D deficiency, unspecified: Secondary | ICD-10-CM | POA: Diagnosis not present

## 2020-09-24 DIAGNOSIS — E7800 Pure hypercholesterolemia, unspecified: Secondary | ICD-10-CM | POA: Diagnosis not present

## 2020-09-24 LAB — BASIC METABOLIC PANEL
BUN: 5 mg/dL — ABNORMAL LOW (ref 6–23)
CO2: 30 mEq/L (ref 19–32)
Calcium: 9.1 mg/dL (ref 8.4–10.5)
Chloride: 100 mEq/L (ref 96–112)
Creatinine, Ser: 0.57 mg/dL (ref 0.40–1.20)
GFR: 109.12 mL/min (ref >60.00)
Glucose, Bld: 81 mg/dL (ref 70–99)
Potassium: 3.4 mEq/L — ABNORMAL LOW (ref 3.5–5.1)
Sodium: 137 mEq/L (ref 135–145)

## 2020-09-24 LAB — LIPID PANEL
Cholesterol: 184 mg/dL (ref 0–200)
HDL: 48.5 mg/dL (ref >39.00)
LDL Cholesterol: 105 mg/dL — ABNORMAL HIGH (ref 0–99)
NonHDL: 135.72
Total CHOL/HDL Ratio: 4
Triglycerides: 154 mg/dL — ABNORMAL HIGH (ref 0.0–149.0)
VLDL: 30.8 mg/dL (ref 0.0–40.0)

## 2020-09-24 LAB — TSH: TSH: 1.15 u[IU]/mL (ref 0.35–4.50)

## 2020-09-24 LAB — HEPATIC FUNCTION PANEL
ALT: 20 U/L (ref 0–35)
AST: 19 U/L (ref 0–37)
Albumin: 4.2 g/dL (ref 3.5–5.2)
Alkaline Phosphatase: 73 U/L (ref 39–117)
Bilirubin, Direct: 0 mg/dL (ref 0.0–0.3)
Total Bilirubin: 0.4 mg/dL (ref 0.2–1.2)
Total Protein: 7.1 g/dL (ref 6.0–8.3)

## 2020-09-24 LAB — IBC PANEL
Iron: 36 ug/dL — ABNORMAL LOW (ref 42–145)
Saturation Ratios: 8.3 % — ABNORMAL LOW (ref 20.0–50.0)
Transferrin: 308 mg/dL (ref 212.0–360.0)

## 2020-09-24 LAB — URINALYSIS, ROUTINE W REFLEX MICROSCOPIC
Bilirubin Urine: NEGATIVE
Hgb urine dipstick: NEGATIVE
Ketones, ur: NEGATIVE
Nitrite: NEGATIVE
Specific Gravity, Urine: <=1.005 — AB (ref 1.000–1.030)
Total Protein, Urine: NEGATIVE
Urine Glucose: NEGATIVE
Urobilinogen, UA: 0.2 (ref 0.0–1.0)
pH: 5.5 (ref 5.0–8.0)

## 2020-09-24 LAB — CBC WITH DIFFERENTIAL/PLATELET
Basophils Absolute: 0 10*3/uL (ref 0.0–0.1)
Basophils Relative: 0.4 % (ref 0.0–3.0)
Eosinophils Absolute: 0.5 10*3/uL (ref 0.0–0.7)
Eosinophils Relative: 4.4 % (ref 0.0–5.0)
HCT: 42.7 % (ref 36.0–46.0)
Hemoglobin: 14.2 g/dL (ref 12.0–15.0)
Lymphocytes Relative: 28.9 % (ref 12.0–46.0)
Lymphs Abs: 3.1 10*3/uL (ref 0.7–4.0)
MCHC: 33.4 g/dL (ref 30.0–36.0)
MCV: 84.6 fl (ref 78.0–100.0)
Monocytes Absolute: 0.6 10*3/uL (ref 0.1–1.0)
Monocytes Relative: 5.9 % (ref 3.0–12.0)
Neutro Abs: 6.5 10*3/uL (ref 1.4–7.7)
Neutrophils Relative %: 60.4 % (ref 43.0–77.0)
Platelets: 267 10*3/uL (ref 150.0–400.0)
RBC: 5.04 Mil/uL (ref 3.87–5.11)
RDW: 14.7 % (ref 11.5–15.5)
WBC: 10.8 10*3/uL — ABNORMAL HIGH (ref 4.0–10.5)

## 2020-09-24 LAB — VITAMIN D 25 HYDROXY (VIT D DEFICIENCY, FRACTURES): VITD: 22.54 ng/mL — ABNORMAL LOW (ref 30.00–100.00)

## 2020-09-24 LAB — VITAMIN B12: Vitamin B-12: 972 pg/mL — ABNORMAL HIGH (ref 211–911)

## 2020-09-24 LAB — HEMOGLOBIN A1C: Hgb A1c MFr Bld: 5.6 % (ref 4.6–6.5)

## 2020-09-24 LAB — FERRITIN: Ferritin: 11.3 ng/mL (ref 10.0–291.0)

## 2020-09-24 MED ORDER — POTASSIUM CHLORIDE ER 10 MEQ PO TBCR: 10.0000 meq | EXTENDED_RELEASE_TABLET | Freq: Every day | ORAL | 0 refills | Status: DC

## 2020-09-24 MED ORDER — TRULICITY 0.75 MG/0.5ML ~~LOC~~ SOAJ: 0.7500 mg | SUBCUTANEOUS | 3 refills | Status: DC

## 2020-09-24 MED ORDER — CHOLECALCIFEROL 1.25 MG (50000 UT) PO TABS: ORAL_TABLET | ORAL | 0 refills | Status: DC

## 2020-09-24 MED ORDER — TRETINOIN MICROSPHERE 0.04 % EX GEL
CUTANEOUS | 1 refills | Status: DC
Start: 2020-09-24 — End: 2021-07-07

## 2020-09-24 MED ORDER — HYDROCHLOROTHIAZIDE 12.5 MG PO CAPS
ORAL_CAPSULE | ORAL | 3 refills | Status: DC
Start: 2020-09-24 — End: 2021-07-07

## 2020-09-24 MED ORDER — OLMESARTAN MEDOXOMIL 20 MG PO TABS: 20.0000 mg | ORAL_TABLET | Freq: Two times a day (BID) | ORAL | 3 refills | Status: DC

## 2020-09-24 MED ORDER — POLYSACCHARIDE IRON COMPLEX 150 MG PO CAPS: 150.0000 mg | ORAL_CAPSULE | Freq: Every day | ORAL | 0 refills | Status: DC

## 2020-09-24 NOTE — Patient Instructions (Signed)
Please take all medication as prescribed  Please take OTC Vitamin D3 at 2000 units per day, indefinitely, after the 50K cholecalciferol course  Please continue all other medications as before, and refills have been done if requested.  Please have the pharmacy call with any other refills you may need.  Please continue your efforts at being more active, low cholesterol diet, and weight control.  You are otherwise up to date with prevention measures today.  You will be contacted regarding the referral for: PT, colonoscopy, and opthalmology  Please keep your appointments with your specialists as you may have planned  Please go to the LAB at the blood drawing area for the tests to be done  You will be contacted by phone if any changes need to be made immediately.  Otherwise, you will receive a letter about your results with an explanation, but please check with MyChart first.  Please remember to sign up for MyChart if you have not done so, as this will be important to you in the future with finding out test results, communicating by private email, and scheduling acute appointments online when needed.  Please make an Appointment to return for your 1 year visit, or sooner if needed

## 2020-09-24 NOTE — Assessment & Plan Note (Signed)
BP Readings from Last 3 Encounters:  09/24/20 138/84  03/21/20 117/83  08/21/19 110/66   Stable, pt to continue medical treatment to change amlodipine to olmesartan for better control, and hct to continue  Current Outpatient Medications (Endocrine & Metabolic):  Marland Kitchen  Dulaglutide (TRULICITY) 0.75 MG/0.5ML SOPN, Inject 0.75 mg into the skin once a week.  Current Outpatient Medications (Cardiovascular):  .  olmesartan (BENICAR) 20 MG tablet, Take 1 tablet (20 mg total) by mouth in the morning and at bedtime. .  hydrochlorothiazide (MICROZIDE) 12.5 MG capsule, 1 tab by mouth twice per day as needed    Current Outpatient Medications (Hematological):  .  iron polysaccharides (NU-IRON) 150 MG capsule, Take 1 capsule (150 mg total) by mouth daily.  Current Outpatient Medications (Other):  Marland Kitchen  Cholecalciferol 1.25 MG (50000 UT) TABS, 1 tab by mouth once weekly .  potassium chloride (KLOR-CON) 10 MEQ tablet, Take 1 tablet (10 mEq total) by mouth daily for 3 days. Marland Kitchen  tretinoin microspheres (RETIN-A MICRO) 0.04 % gel, Retin-A Micro Pump 0.04 % topical gel

## 2020-09-24 NOTE — Assessment & Plan Note (Signed)
Last vitamin D Lab Results  Component Value Date   VD25OH 22.54 (L) 09/24/2020   Low to start oral replacement

## 2020-09-24 NOTE — Assessment & Plan Note (Signed)
Ok for PT trial for right knee djd

## 2020-09-24 NOTE — Assessment & Plan Note (Signed)
Age and sex appropriate education and counseling updated with regular exercise and diet Referrals for preventative services - for colonoscopy and optho referrals Immunizations addressed - none needed Smoking counseling  - none needed Evidence for depression or other mood disorder - none significant Most recent labs reviewed. I have personally reviewed and have noted: 1) the patient's medical and social history 2) The patient's current medications and supplements 3) The patient's height, weight, and BMI have been recorded in the chart

## 2020-09-24 NOTE — Progress Notes (Signed)
Patient ID: Ashley Wyatt, female   DOB: [birth year], 45 y.o.   MRN: [id number]         Chief Complaint:: wellness exam and Annual Exam (Physical; patient would like to discuss elevated BP)  , low vit d, right knee djd       HPI:  Ashley Wyatt is a 45 y.o. female here for wellness exam; due for colonoscopy, optho exam (needs referral) o/w up to date with preventive referrals and immunizations.                          Also has ongoing right knee pain constant dull and sharp, moderate, may need TKR eventually but asks for PT before has to consider this, worse to walk, better to sit for over 6 months.  Also BP has been mildly elevated in the 140s, but during intermittent fasting has actually been on the low normal side so has sometime managed her BP meds by taking less amlodipine and at time bid HCT for higher BP.  Pt denies chest pain, increased sob or doe, wheezing, orthopnea, PND, increased LE swelling, palpitations, dizziness or syncope.  Denies new worsening neuro focal s/s.   Pt denies polydipsia, polyuria, Not taking VIt D.  Has been unable to lose several lbs from last yr, but has plateued and unable to lose further.  Recent insulin level normal oct 2021  Wt Readings from Last 3 Encounters:  09/24/20 215 lb 12.8 oz (97.9 kg)  08/21/19 221 lb 12.8 oz (100.6 kg)  07/05/18 215 lb (97.5 kg)   BP Readings from Last 3 Encounters:  09/24/20 138/84  03/21/20 117/83  08/21/19 110/66   Immunization History  Administered Date(s) Administered  . Hep A / Hep B 11/01/2009  . Hepatitis A, Adult 02/15/2018  . Hepb-cpg 02/15/2018, 03/20/2018  . Influenza Split 03/10/2011  . Influenza Whole 04/10/2008, 03/12/2010  . Influenza-Unspecified 03/19/2017, 02/21/2018  . Moderna Sars-Covid-2 Vaccination 06/10/2020  . PFIZER(Purple Top)SARS-COV-2 Vaccination 08/15/2019, 09/03/2019  . Pneumococcal Polysaccharide-23 12/17/2018  . Td 11/01/2009   Health Maintenance Due  Topic Date Due  . OPHTHALMOLOGY EXAM   Never done  . COLONOSCOPY (Pts 45-22yrs Insurance coverage will need to be confirmed)  Never done      Past Medical History:  Diagnosis Date  . ALLERGIC RHINITIS 08/20/2008   Qualifier: Diagnosis of  By: Everardo All MD, Cleophas Dunker   . ANEMIA-IRON DEFICIENCY 11/01/2009   Qualifier: Diagnosis of  By: [name] MD, Len Blalock   . DIABETES MELLITUS, GESTATIONAL, HX OF 11/01/2009   Qualifier: Diagnosis of  By: [name] MD, Len Blalock   . ECZEMA 08/20/2008   Qualifier: Diagnosis of  By: Everardo All MD, Cleophas Dunker HYPERCHOLESTEROLEMIA 08/20/2008   Qualifier: Diagnosis of  By: Everardo All MD, Cleophas Dunker   . HYPERTENSION 02/11/2007   Qualifier: Diagnosis of  By: Everardo All MD, Cleophas Dunker   . Hypertriglyceridemia 07/11/2013  . Impaired glucose tolerance 07/11/2013  . STRABISMUS 06/30/2010   Qualifier: Diagnosis of  By: [name] MD, Len Blalock   . VITAMIN D DEFICIENCY 11/01/2009   Qualifier: Diagnosis of  By: [name] MD, Len Blalock    Past Surgical History:  Procedure Laterality Date  . right knee surgury     arthroscopy  . TONSILLECTOMY      reports that she has never smoked. She has never used smokeless tobacco. She reports that she does not drink alcohol and does not use drugs. family history includes Breast cancer  in an other family member; Hypertension in an other family member. No Known Allergies No current outpatient medications on file prior to visit.   No current facility-administered medications on file prior to visit.        ROS:  All others reviewed and negative.  Objective        PE:  BP 138/84 (BP Location: Right Arm, Patient Position: Sitting, Cuff Size: Large)   Pulse 75   Temp (!) 97.5 F (36.4 C) (Oral)   Ht 5\' 5"  (1.651 m)   Wt 215 lb 12.8 oz (97.9 kg)   LMP 09/18/2020   SpO2 98%   BMI 35.91 kg/m                 Constitutional: Pt appears in NAD               HENT: Head: NCAT.                Right Ear: External ear normal.                 Left Ear: External ear normal.                Eyes: . Pupils are equal, round,  and reactive to light. Conjunctivae and EOM are normal               Nose: without d/c or deformity               Neck: Neck supple. Gross normal ROM               Cardiovascular: Normal rate and regular rhythm.                 Pulmonary/Chest: Effort normal and breath sounds without rales or wheezing.                Abd:  Soft, NT, ND, + BS, no organomegaly               Neurological: Pt is alert. At baseline orientation, motor grossly intact               Skin: Skin is warm. No rashes, no other new lesions, LE edema - none               Psychiatric: Pt behavior is normal without agitation   Micro: none  Cardiac tracings I have personally interpreted today:  none  Pertinent Radiological findings (summarize): none   Lab Results  Component Value Date   WBC 10.8 (H) 09/24/2020   HGB 14.2 09/24/2020   HCT 42.7 09/24/2020   PLT 267.0 09/24/2020   GLUCOSE 81 09/24/2020   CHOL 184 09/24/2020   TRIG 154.0 (H) 09/24/2020   HDL 48.50 09/24/2020   LDLDIRECT 81.0 08/03/2017   LDLCALC 105 (H) 09/24/2020   ALT 20 09/24/2020   AST 19 09/24/2020   NA 137 09/24/2020   K 3.4 (L) 09/24/2020   CL 100 09/24/2020   CREATININE 0.57 09/24/2020   BUN 5 (L) 09/24/2020   CO2 30 09/24/2020   TSH 1.15 09/24/2020   HGBA1C 5.6 09/24/2020   Assessment/Plan:  Ashley Wyatt is a 45 y.o. Other or two or more races [6] female with  has a past medical history of ALLERGIC RHINITIS (08/20/2008), ANEMIA-IRON DEFICIENCY (11/01/2009), DIABETES MELLITUS, GESTATIONAL, HX OF (11/01/2009), ECZEMA (08/20/2008), HYPERCHOLESTEROLEMIA (08/20/2008), HYPERTENSION (02/11/2007), Hypertriglyceridemia (07/11/2013), Impaired glucose tolerance (07/11/2013), STRABISMUS (06/30/2010), and VITAMIN D DEFICIENCY (11/01/2009).  Encounter for well adult  exam with abnormal findings Age and sex appropriate education and counseling updated with regular exercise and diet Referrals for preventative services - for colonoscopy and optho  referrals Immunizations addressed - none needed Smoking counseling  - none needed Evidence for depression or other mood disorder - none significant Most recent labs reviewed. I have personally reviewed and have noted: 1) the patient's medical and social history 2) The patient's current medications and supplements 3) The patient's height, weight, and BMI have been recorded in the chart   Impaired glucose tolerance Lab Results  Component Value Date   HGBA1C 5.6 09/24/2020   Stable, pt to continue current medical treatment  - diet, but also trulicity to assist with wt loss   Obesity For trulicity asd for wt loss  HYPERCHOLESTEROLEMIA Lab Results  Component Value Date   LDLCALC 105 (H) 09/24/2020   Stable, pt to continue current statin  - diet, decliens statin for now   Essential hypertension BP Readings from Last 3 Encounters:  09/24/20 138/84  03/21/20 117/83  08/21/19 110/66   Stable, pt to continue medical treatment to change amlodipine to olmesartan for better control, and hct to continue  Current Outpatient Medications (Endocrine & Metabolic):  Marland Kitchen  Dulaglutide (TRULICITY) 0.75 MG/0.5ML SOPN, Inject 0.75 mg into the skin once a week.  Current Outpatient Medications (Cardiovascular):  .  olmesartan (BENICAR) 20 MG tablet, Take 1 tablet (20 mg total) by mouth in the morning and at bedtime. .  hydrochlorothiazide (MICROZIDE) 12.5 MG capsule, 1 tab by mouth twice per day as needed    Current Outpatient Medications (Hematological):  .  iron polysaccharides (NU-IRON) 150 MG capsule, Take 1 capsule (150 mg total) by mouth daily.  Current Outpatient Medications (Other):  Marland Kitchen  Cholecalciferol 1.25 MG (50000 UT) TABS, 1 tab by mouth once weekly .  potassium chloride (KLOR-CON) 10 MEQ tablet, Take 1 tablet (10 mEq total) by mouth daily for 3 days. Marland Kitchen  tretinoin microspheres (RETIN-A MICRO) 0.04 % gel, Retin-A Micro Pump 0.04 % topical gel    ANEMIA-IRON DEFICIENCY No  recent overt bleeding, for iron labs  Vitamin D deficiency Last vitamin D Lab Results  Component Value Date   VD25OH 22.54 (L) 09/24/2020   Low to start oral replacement   Chronic pain of right knee Ok for PT trial for right knee djd  B12 deficiency Lab Results  Component Value Date   VITAMINB12 972 (H) 09/24/2020   Stable, cont oral replacement - b12 1000 mcg qd   Followup: Return in about 1 year (around 09/24/2021).  Oliver Barre, MD 09/24/2020 9:17 PM [facility]

## 2020-09-24 NOTE — Assessment & Plan Note (Signed)
No recent overt bleeding, for iron labs

## 2020-09-24 NOTE — Assessment & Plan Note (Signed)
For trulicity asd for wt loss

## 2020-09-24 NOTE — Assessment & Plan Note (Signed)
Lab Results  Component Value Date   LDLCALC 105 (H) 09/24/2020   Stable, pt to continue current statin  - diet, decliens statin for now

## 2020-09-24 NOTE — Assessment & Plan Note (Signed)
Lab Results  Component Value Date   VITAMINB12 972 (H) 09/24/2020   Stable, cont oral replacement - b12 1000 mcg qd

## 2020-09-24 NOTE — Assessment & Plan Note (Signed)
Lab Results  Component Value Date   HGBA1C 5.6 09/24/2020   Stable, pt to continue current medical treatment  - diet, but also trulicity to assist with wt loss

## 2020-09-27 ENCOUNTER — Telehealth: Payer: Self-pay

## 2020-09-27 MED ORDER — VITAMIN D (ERGOCALCIFEROL) 1.25 MG (50000 UNIT) PO CAPS: 50000.0000 [IU] | ORAL_CAPSULE | ORAL | 0 refills | Status: DC

## 2020-09-27 NOTE — Telephone Encounter (Signed)
PA Key :  [id number]  Ozempic (1 MG/DOSE) 2MG /1.5ML pen-injectors  Waiting decision.

## 2020-09-27 NOTE — Telephone Encounter (Signed)
PA key : [id number]   Trulicity 0.75MG /0.5ML pen-injectors    Waiting on decision.

## 2020-09-27 NOTE — Addendum Note (Signed)
Addended by: [name] on: 09/27/2020 08:05 PM   Modules accepted: Orders

## 2020-10-04 NOTE — Telephone Encounter (Signed)
PA key: [id number]   PA  Was denied for Ozempic (1mg /dose) 2mg /1.51ml pen-injectors.  PA key: [id number]  PA was denied for Trulicity pen-injectors.

## 2020-10-12 ENCOUNTER — Encounter: Payer: Self-pay | Admitting: Internal Medicine

## 2020-10-27 ENCOUNTER — Ambulatory Visit: Payer: Managed Care, Other (non HMO) | Admitting: Physical Therapy

## 2021-01-13 ENCOUNTER — Other Ambulatory Visit: Payer: Self-pay | Admitting: Internal Medicine

## 2021-01-16 NOTE — Telephone Encounter (Signed)
Please refill as per office routine med refill policy (all routine meds refilled for 3 mo or monthly per pt preference up to one year from last visit, then month to month grace period for 3 mo, then further med refills will have to be denied)  

## 2021-01-17 ENCOUNTER — Other Ambulatory Visit: Payer: Self-pay | Admitting: Internal Medicine

## 2021-01-19 ENCOUNTER — Telehealth: Payer: Self-pay

## 2021-01-19 NOTE — Telephone Encounter (Signed)
PA started for Trentinoin gel  (Key: Kallie Edward

## 2021-07-07 ENCOUNTER — Ambulatory Visit: Payer: Managed Care, Other (non HMO) | Admitting: Internal Medicine

## 2021-07-07 ENCOUNTER — Other Ambulatory Visit: Payer: Self-pay

## 2021-07-07 ENCOUNTER — Encounter: Payer: Self-pay | Admitting: Internal Medicine

## 2021-07-07 VITALS — BP 120/70 | HR 105 | Temp 98.6°F | Ht 65.0 in | Wt 240.0 lb

## 2021-07-07 DIAGNOSIS — E559 Vitamin D deficiency, unspecified: Secondary | ICD-10-CM

## 2021-07-07 DIAGNOSIS — I10 Essential (primary) hypertension: Secondary | ICD-10-CM | POA: Diagnosis not present

## 2021-07-07 DIAGNOSIS — D509 Iron deficiency anemia, unspecified: Secondary | ICD-10-CM

## 2021-07-07 DIAGNOSIS — Z1211 Encounter for screening for malignant neoplasm of colon: Secondary | ICD-10-CM | POA: Diagnosis not present

## 2021-07-07 DIAGNOSIS — Z0001 Encounter for general adult medical examination with abnormal findings: Secondary | ICD-10-CM | POA: Diagnosis not present

## 2021-07-07 DIAGNOSIS — R7302 Impaired glucose tolerance (oral): Secondary | ICD-10-CM | POA: Diagnosis not present

## 2021-07-07 DIAGNOSIS — E538 Deficiency of other specified B group vitamins: Secondary | ICD-10-CM | POA: Diagnosis not present

## 2021-07-07 DIAGNOSIS — R062 Wheezing: Secondary | ICD-10-CM

## 2021-07-07 MED ORDER — BIMATOPROST 0.03 % EX SOLN: CUTANEOUS | 12 refills | Status: DC

## 2021-07-07 MED ORDER — ALBUTEROL SULFATE HFA 108 (90 BASE) MCG/ACT IN AERS: 2.0000 | INHALATION_SPRAY | Freq: Four times a day (QID) | RESPIRATORY_TRACT | 3 refills | Status: DC | PRN

## 2021-07-07 MED ORDER — CHOLECALCIFEROL 50 MCG (2000 UT) PO TABS: ORAL_TABLET | ORAL | 99 refills | Status: AC

## 2021-07-07 MED ORDER — TRETINOIN MICROSPHERE 0.04 % EX GEL: CUTANEOUS | 1 refills | Status: AC

## 2021-07-07 MED ORDER — TIRZEPATIDE 2.5 MG/0.5ML ~~LOC~~ SOAJ: 2.5000 mg | SUBCUTANEOUS | 3 refills | Status: DC

## 2021-07-07 NOTE — Progress Notes (Signed)
Patient ID: Ashley Wyatt, female   DOB: [birth year], 46 y.o.   MRN: [id number]         Chief Complaint:: wellness exam and Follow-up (Discuss Mounjaro and wheezing and coughing)  , eyelash treatment, obesity, hyperglycemia, low vit d       HPI:  Ashley Wyatt is a 46 y.o. female here for wellness exam; plans to f/u optho appt on her own, due for colonoscopy,  declines tdap, o/w up to date.                        Also  Here with 7 days acute onset fever, facial pain, pressure, headache, general weakness and malaise, and greenish d/c, with mild ST and cough, but pt denies chest pain, orthopnea, PND, increased LE swelling, palpitations, dizziness or syncope, but has had mild non prod cough, wheezing, sob/doe in the past 2 -3 days.  Also unfortunately  has gained approx 20 lbs in past 2 yr, working hard, hard to control diet and exercise.   Pt denies polydipsia, polyuria, Asks for mounjaro to assist with sugar and wt loss.  Also taking Vit D 1000 qd, not sure if should take more.  Also, has had increased eyelash thining in the past yr, asks for latisse tx.   Pt denies other night sweats, loss of appetite, or other constitutional symptoms    Denies worsening depressive symptoms, suicidal ideation, or panic Wt Readings from Last 3 Encounters:  07/07/21 240 lb (108.9 kg)  09/24/20 215 lb 12.8 oz (97.9 kg)  08/21/19 221 lb 12.8 oz (100.6 kg)   BP Readings from Last 3 Encounters:  07/07/21 120/70  09/24/20 138/84  03/21/20 117/83   Immunization History  Administered Date(s) Administered   Hep A / Hep B 11/01/2009   Hepatitis A, Adult 02/15/2018   Hepb-cpg 02/15/2018, 03/20/2018   Influenza Split 03/10/2011   Influenza Whole 04/10/2008, 03/12/2010   Influenza-Unspecified 03/19/2017, 02/21/2018, 05/10/2021   Moderna Sars-Covid-2 Vaccination 06/10/2020   PFIZER Comirnaty(Gray Top)Covid-19 Tri-Sucrose Vaccine 08/15/2019, 09/03/2019   PFIZER(Purple Top)SARS-COV-2 Vaccination 08/15/2019, 09/03/2019    Pneumococcal Polysaccharide-23 12/17/2018   Td 11/01/2009   Health Maintenance Due  Topic Date Due   OPHTHALMOLOGY EXAM  Never done   COLONOSCOPY (Pts 45-69yrs Insurance coverage will need to be confirmed)  Never done      Past Medical History:  Diagnosis Date   ALLERGIC RHINITIS 08/20/2008   Qualifier: Diagnosis of  By: Loanne Drilling MD, Yevonne Pax DEFICIENCY 11/01/2009   Qualifier: Diagnosis of  By: Jenny Reichmann MD, [place], GESTATIONAL, HX OF 11/01/2009   Qualifier: Diagnosis of  By: Jenny Reichmann MD, [place] 08/20/2008   Qualifier: Diagnosis of  By: Loanne Drilling MD, Sean A    HYPERCHOLESTEROLEMIA 08/20/2008   Qualifier: Diagnosis of  By: Loanne Drilling MD, Sean A    HYPERTENSION 02/11/2007   Qualifier: Diagnosis of  By: Loanne Drilling MD, Sean A    Hypertriglyceridemia 07/11/2013   Impaired glucose tolerance 07/11/2013   STRABISMUS 06/30/2010   Qualifier: Diagnosis of  By: Jenny Reichmann MD, Hunt Oris    VITAMIN D DEFICIENCY 11/01/2009   Qualifier: Diagnosis of  By: Jenny Reichmann MD, Hunt Oris    Past Surgical History:  Procedure Laterality Date   right knee surgury     arthroscopy   TONSILLECTOMY      reports that she has never smoked. She has never used smokeless tobacco. She reports that she  does not drink alcohol and does not use drugs. family history includes Breast cancer in an other family member; Hypertension in an other family member. No Known Allergies Current Outpatient Medications on File Prior to Visit  Medication Sig Dispense Refill   amLODipine (NORVASC) 5 MG tablet TAKE ONE TABLET BY MOUTH EVERY MORNING AND TAKE ONE TABLET BY MOUTH AT BEDTIME 180 tablet 3   olmesartan (BENICAR) 20 MG tablet Take 1 tablet (20 mg total) by mouth in the morning and at bedtime. 180 tablet 3   potassium chloride (KLOR-CON) 10 MEQ tablet Take 1 tablet (10 mEq total) by mouth daily for 3 days. 3 tablet 0   No current facility-administered medications on file prior to visit.        ROS:  All others reviewed  and negative.  Objective        PE:  BP 120/70 (BP Location: Right Arm, Patient Position: Sitting, Cuff Size: Large)    Pulse (!) 105    Temp 98.6 F (37 C) (Oral)    Ht 5\' 5"  (1.651 m)    Wt 240 lb (108.9 kg)    SpO2 96%    BMI 39.94 kg/m                 Constitutional: Pt appears in NAD               HENT: Head: NCAT.                Right Ear: External ear normal.                 Left Ear: External ear normal.                Eyes: . Pupils are equal, round, and reactive to light. Conjunctivae and EOM are normal               Nose: without d/c or deformity               Neck: Neck supple. Gross normal ROM               Cardiovascular: Normal rate and regular rhythm.                 Pulmonary/Chest: Effort normal and breath sounds decreased without rales or wheezing.                Abd:  Soft, NT, ND, + BS, no organomegaly               Neurological: Pt is alert. At baseline orientation, motor grossly intact               Skin: Skin is warm. No rashes, no other new lesions, LE edema - none               Psychiatric: Pt behavior is normal without agitation   Micro: none  Cardiac tracings I have personally interpreted today:  none  Pertinent Radiological findings (summarize): none   Lab Results  Component Value Date   WBC 12.4 (H) 07/07/2021   HGB 12.1 07/07/2021   HCT 37.5 07/07/2021   PLT 261.0 07/07/2021   GLUCOSE 89 07/07/2021   CHOL 193 07/07/2021   TRIG (H) 07/07/2021    552.0 Triglyceride is over 400; calculations on Lipids are invalid.   HDL 39.70 07/07/2021   LDLDIRECT 109.0 07/07/2021   LDLCALC 105 (H) 09/24/2020   ALT 16 07/07/2021   AST 17 07/07/2021  NA 140 07/07/2021   K 3.9 07/07/2021   CL 101 07/07/2021   CREATININE 0.57 07/07/2021   BUN 11 07/07/2021   CO2 30 07/07/2021   TSH 1.27 07/07/2021   HGBA1C 6.2 07/07/2021   Assessment/Plan:  Ashley Wyatt is a 46 y.o. Other or two or more races [6] female with  has a past medical history of ALLERGIC  RHINITIS (08/20/2008), ANEMIA-IRON DEFICIENCY (11/01/2009), DIABETES MELLITUS, GESTATIONAL, HX OF (11/01/2009), ECZEMA (08/20/2008), HYPERCHOLESTEROLEMIA (08/20/2008), HYPERTENSION (02/11/2007), Hypertriglyceridemia (07/11/2013), Impaired glucose tolerance (07/11/2013), STRABISMUS (06/30/2010), and VITAMIN D DEFICIENCY (11/01/2009).  Encounter for well adult exam with abnormal findings Age and sex appropriate education and counseling updated with regular exercise and diet Referrals for preventative services - for colonoscopy, plans to f/u optho soon herself Immunizations addressed - declines tdap Smoking counseling  - none needed Evidence for depression or other mood disorder - none significant Most recent labs reviewed. I have personally reviewed and have noted: 1) the patient's medical and social history 2) The patient's current medications and supplements 3) The patient's height, weight, and BMI have been recorded in the chart   ANEMIA-IRON DEFICIENCY Also with hx of low iron, no recent overt bleeding except menses, for f/u lab  B12 deficiency Lab Results  Component Value Date   VITAMINB12 527 07/07/2021   Stable, cont oral replacement - b12 1000 mcg qd   Essential hypertension BP Readings from Last 3 Encounters:  07/07/21 120/70  09/24/20 138/84  03/21/20 117/83   Stable, pt to continue medical treatment norvasc, benicar   Impaired glucose tolerance Lab Results  Component Value Date   HGBA1C 6.2 07/07/2021   Stable overall but wt increasing, pt ok to start mounjaro for preDM and obesity   Vitamin D deficiency Last vitamin D Lab Results  Component Value Date   VD25OH 27.53 (L) 07/07/2021   Low, pt for increased oral replacement to 2000 u qd  Wheezing Pt essentially resolving recent viral illness and wheezing - for albuterol prn,  to f/u any worsening symptoms or concerns  Followup: Return in about 6 months (around 01/04/2022).  Cathlean Cower, MD 07/10/2021 3:16 PM [facility]

## 2021-07-07 NOTE — Patient Instructions (Addendum)
You will be contacted regarding the referral for: colonoscopy  Please take all new medication as prescribed - the latisse and mounjaro and albuterol  Please continue all other medications as before, and refills have been done if requested - retin A  Please take OTC Vitamin D3 at 2000 units per day, indefinitely    Please have the pharmacy call with any other refills you may need.  Please continue your efforts at being more active, low cholesterol diet, and weight control.  You are otherwise up to date with prevention measures today.  Please keep your appointments with your specialists as you may have planned  Please go to the LAB at the blood drawing area for the tests to be done  You will be contacted by phone if any changes need to be made immediately.  Otherwise, you will receive a letter about your results with an explanation, but please check with MyChart first.  Please remember to sign up for MyChart if you have not done so, as this will be important to you in the future with finding out test results, communicating by private email, and scheduling acute appointments online when needed.  Please make an Appointment to return in 6 months, or sooner if needed

## 2021-07-08 ENCOUNTER — Other Ambulatory Visit: Payer: Self-pay | Admitting: Internal Medicine

## 2021-07-08 LAB — URINALYSIS, ROUTINE W REFLEX MICROSCOPIC
Bilirubin Urine: NEGATIVE
Hgb urine dipstick: NEGATIVE
Ketones, ur: NEGATIVE
Nitrite: NEGATIVE
RBC / HPF: NONE SEEN (ref 0–?)
Specific Gravity, Urine: 1.01 (ref 1.000–1.030)
Total Protein, Urine: NEGATIVE
Urine Glucose: NEGATIVE
Urobilinogen, UA: 0.2 (ref 0.0–1.0)
pH: 7 (ref 5.0–8.0)

## 2021-07-08 LAB — HEPATIC FUNCTION PANEL
ALT: 16 U/L (ref 0–35)
AST: 17 U/L (ref 0–37)
Albumin: 4.1 g/dL (ref 3.5–5.2)
Alkaline Phosphatase: 75 U/L (ref 39–117)
Bilirubin, Direct: 0 mg/dL (ref 0.0–0.3)
Total Bilirubin: 0.4 mg/dL (ref 0.2–1.2)
Total Protein: 6.9 g/dL (ref 6.0–8.3)

## 2021-07-08 LAB — LIPID PANEL
Cholesterol: 193 mg/dL (ref 0–200)
HDL: 39.7 mg/dL (ref >39.00)
Total CHOL/HDL Ratio: 5
Triglycerides: 552 mg/dL — ABNORMAL HIGH (ref 0.0–149.0)

## 2021-07-08 LAB — BASIC METABOLIC PANEL
BUN: 11 mg/dL (ref 6–23)
CO2: 30 mEq/L (ref 19–32)
Calcium: 9.4 mg/dL (ref 8.4–10.5)
Chloride: 101 mEq/L (ref 96–112)
Creatinine, Ser: 0.57 mg/dL (ref 0.40–1.20)
GFR: 108.52 mL/min (ref >60.00)
Glucose, Bld: 89 mg/dL (ref 70–99)
Potassium: 3.9 mEq/L (ref 3.5–5.1)
Sodium: 140 mEq/L (ref 135–145)

## 2021-07-08 LAB — CBC WITH DIFFERENTIAL/PLATELET
Basophils Absolute: 0.1 10*3/uL (ref 0.0–0.1)
Basophils Relative: 1.1 % (ref 0.0–3.0)
Eosinophils Absolute: 0.4 10*3/uL (ref 0.0–0.7)
Eosinophils Relative: 3 % (ref 0.0–5.0)
HCT: 37.5 % (ref 36.0–46.0)
Hemoglobin: 12.1 g/dL (ref 12.0–15.0)
Lymphocytes Relative: 24.1 % (ref 12.0–46.0)
Lymphs Abs: 3 10*3/uL (ref 0.7–4.0)
MCHC: 32.4 g/dL (ref 30.0–36.0)
MCV: 82.8 fl (ref 78.0–100.0)
Monocytes Absolute: 0.7 10*3/uL (ref 0.1–1.0)
Monocytes Relative: 5.5 % (ref 3.0–12.0)
Neutro Abs: 8.2 10*3/uL — ABNORMAL HIGH (ref 1.4–7.7)
Neutrophils Relative %: 66.3 % (ref 43.0–77.0)
Platelets: 261 10*3/uL (ref 150.0–400.0)
RBC: 4.54 Mil/uL (ref 3.87–5.11)
RDW: 14.1 % (ref 11.5–15.5)
WBC: 12.4 10*3/uL — ABNORMAL HIGH (ref 4.0–10.5)

## 2021-07-08 LAB — FERRITIN: Ferritin: 8.2 ng/mL — ABNORMAL LOW (ref 10.0–291.0)

## 2021-07-08 LAB — IBC PANEL
Iron: 49 ug/dL (ref 42–145)
Saturation Ratios: 10.4 % — ABNORMAL LOW (ref 20.0–50.0)
TIBC: 470.4 ug/dL — ABNORMAL HIGH (ref 250.0–450.0)
Transferrin: 336 mg/dL (ref 212.0–360.0)

## 2021-07-08 LAB — TSH: TSH: 1.27 u[IU]/mL (ref 0.35–5.50)

## 2021-07-08 LAB — HEMOGLOBIN A1C: Hgb A1c MFr Bld: 6.2 % (ref 4.6–6.5)

## 2021-07-08 LAB — LDL CHOLESTEROL, DIRECT: Direct LDL: 109 mg/dL

## 2021-07-08 LAB — VITAMIN D 25 HYDROXY (VIT D DEFICIENCY, FRACTURES): VITD: 27.53 ng/mL — ABNORMAL LOW (ref 30.00–100.00)

## 2021-07-08 LAB — VITAMIN B12: Vitamin B-12: 527 pg/mL (ref 211–911)

## 2021-07-10 ENCOUNTER — Encounter: Payer: Self-pay | Admitting: Internal Medicine

## 2021-07-10 DIAGNOSIS — R062 Wheezing: Secondary | ICD-10-CM | POA: Insufficient documentation

## 2021-07-10 NOTE — Assessment & Plan Note (Addendum)
Lab Results  Component Value Date   [id number] 07/07/2021   Stable, cont oral replacement - b12 1000 mcg qd

## 2021-07-10 NOTE — Assessment & Plan Note (Signed)
Also with hx of low iron, no recent overt bleeding except menses, for f/u lab

## 2021-07-10 NOTE — Assessment & Plan Note (Signed)
Last vitamin D Lab Results  Component Value Date   VD25OH 27.53 (L) 07/07/2021   Low, pt for increased oral replacement to 2000 u qd

## 2021-07-10 NOTE — Assessment & Plan Note (Signed)
Lab Results  Component Value Date   HGBA1C 6.2 07/07/2021   Stable overall but wt increasing, pt ok to start mounjaro for preDM and obesity

## 2021-07-10 NOTE — Assessment & Plan Note (Signed)
BP Readings from Last 3 Encounters:  07/07/21 120/70  09/24/20 138/84  03/21/20 117/83   Stable, pt to continue medical treatment norvasc, benicar

## 2021-07-10 NOTE — Assessment & Plan Note (Signed)
Age and sex appropriate education and counseling updated with regular exercise and diet Referrals for preventative services - for colonoscopy, plans to f/u optho soon herself Immunizations addressed - declines tdap Smoking counseling  - none needed Evidence for depression or other mood disorder - none significant Most recent labs reviewed. I have personally reviewed and have noted: 1) the patient's medical and social history 2) The patient's current medications and supplements 3) The patient's height, weight, and BMI have been recorded in the chart

## 2021-07-10 NOTE — Assessment & Plan Note (Signed)
Pt essentially resolving recent viral illness and wheezing - for albuterol prn,  to f/u any worsening symptoms or concerns

## 2021-07-14 ENCOUNTER — Telehealth: Payer: Self-pay | Admitting: Internal Medicine

## 2021-07-14 NOTE — Telephone Encounter (Signed)
[place] called requesting the diagnosis code for the [facility] medication prescribed to PT.  Can reach the pharmacy line at: [phone]

## 2021-07-14 NOTE — Telephone Encounter (Signed)
Unable to reach pharmacy, Dx code [phone]

## 2021-08-01 MED ORDER — TIRZEPATIDE 5 MG/0.5ML ~~LOC~~ SOAJ: 5.0000 mg | SUBCUTANEOUS | 3 refills | Status: DC

## 2021-08-01 NOTE — Telephone Encounter (Signed)
Patient calling in  Says she is almost out of the tirzepatide [facility]) 2.5 MG/0.5ML Pen  & wants provider to know that she has been tolerating the medication well & thinks she is ready to increase to the 5mg  dosage  Pharmacy:   [id number] - [place], [place]  Phone:  [phone] Fax:  [phone]

## 2021-08-01 NOTE — Telephone Encounter (Signed)
[place] for increase to 5 mg - done erx

## 2021-08-02 NOTE — Telephone Encounter (Signed)
Called patient and left voice message in reference to her medication. Patient to call back to office.

## 2021-08-03 NOTE — Telephone Encounter (Signed)
Patient called in reference to her [facility] dosage increase. Patient verbalize understanding. No further questions

## 2021-08-10 ENCOUNTER — Encounter: Payer: Self-pay | Admitting: Internal Medicine

## 2021-09-02 ENCOUNTER — Encounter: Payer: Self-pay | Admitting: Internal Medicine

## 2021-09-02 MED ORDER — ONDANSETRON HCL 4 MG PO TABS: 4.0000 mg | ORAL_TABLET | Freq: Three times a day (TID) | ORAL | 0 refills | Status: AC | PRN

## 2021-09-08 ENCOUNTER — Other Ambulatory Visit: Payer: Self-pay | Admitting: Internal Medicine

## 2021-09-09 NOTE — Telephone Encounter (Signed)
Please refill as per office routine med refill policy (all routine meds to be refilled for 3 mo or monthly (per pt preference) up to one year from last visit, then month to month grace period for 3 mo, then further med refills will have to be denied) ? ?

## 2022-01-25 ENCOUNTER — Telehealth: Payer: Self-pay

## 2022-01-25 DIAGNOSIS — R7302 Impaired glucose tolerance (oral): Secondary | ICD-10-CM

## 2022-01-25 DIAGNOSIS — E7800 Pure hypercholesterolemia, unspecified: Secondary | ICD-10-CM

## 2022-01-25 DIAGNOSIS — I10 Essential (primary) hypertension: Secondary | ICD-10-CM

## 2022-01-25 DIAGNOSIS — R9431 Abnormal electrocardiogram [ECG] [EKG]: Secondary | ICD-10-CM

## 2022-01-25 NOTE — Telephone Encounter (Signed)
Patient's spouse called stating that both he and patient would like orders for CT Calcium Scoring

## 2022-01-25 NOTE — Addendum Note (Signed)
Addended by: [name] on: 01/25/2022 04:55 PM   Modules accepted: Orders

## 2022-01-25 NOTE — Telephone Encounter (Signed)
Ok this is ordered 

## 2022-02-24 ENCOUNTER — Ambulatory Visit (HOSPITAL_BASED_OUTPATIENT_CLINIC_OR_DEPARTMENT_OTHER)
Admission: RE | Admit: 2022-02-24 | Discharge: 2022-02-24 | Disposition: A | Discharge status: Home / Self Care | Payer: Managed Care, Other (non HMO) | Source: Ambulatory Visit | Attending: Internal Medicine | Admitting: Internal Medicine

## 2022-02-24 DIAGNOSIS — I10 Essential (primary) hypertension: Secondary | ICD-10-CM

## 2022-02-24 DIAGNOSIS — E7800 Pure hypercholesterolemia, unspecified: Secondary | ICD-10-CM | POA: Insufficient documentation

## 2022-02-24 DIAGNOSIS — R9431 Abnormal electrocardiogram [ECG] [EKG]: Secondary | ICD-10-CM | POA: Insufficient documentation

## 2022-02-24 DIAGNOSIS — R7302 Impaired glucose tolerance (oral): Secondary | ICD-10-CM

## 2022-05-02 ENCOUNTER — Telehealth: Payer: Self-pay | Admitting: Internal Medicine

## 2022-05-02 NOTE — Telephone Encounter (Signed)
Patient called and said that the tirzepatide [facility]) 5 MG/0.5ML Pen  hasn't been working as much and wanted to increase dose to 7.5 mg is this ok? Call back is [phone]

## 2022-05-03 MED ORDER — TIRZEPATIDE 7.5 MG/0.5ML ~~LOC~~ SOAJ: 7.5000 mg | SUBCUTANEOUS | 3 refills | Status: DC

## 2022-05-03 NOTE — Telephone Encounter (Signed)
Patient requesting to increase dosage of mounjaro, please advise

## 2022-05-03 NOTE — Telephone Encounter (Signed)
Ok this is done erx 

## 2022-05-15 ENCOUNTER — Other Ambulatory Visit: Payer: Self-pay | Admitting: Internal Medicine

## 2022-05-21 ENCOUNTER — Encounter: Payer: Self-pay | Admitting: Internal Medicine

## 2022-05-21 NOTE — Progress Notes (Unsigned)
    Subjective:    Patient ID: Ashley Wyatt, female    DOB: Jul 06, 1975, 46 y.o.   MRN: [id number]      HPI Ashley Wyatt is here for No chief complaint on file.    Joint pains -     Medications and allergies reviewed with patient and updated if appropriate.  Current Outpatient Medications on File Prior to Visit  Medication Sig Dispense Refill   albuterol (VENTOLIN HFA) 108 (90 Base) MCG/ACT inhaler Inhale 2 puffs into the lungs every 6 (six) hours as needed for wheezing or shortness of breath. 8 g 3   amLODipine (NORVASC) 5 MG tablet TAKE ONE TABLET BY MOUTH EVERY MORNING AND TAKE ONE TABLET BY MOUTH AT BEDTIME 180 tablet 3   bimatoprost (LATISSE) 0.03 % ophthalmic solution Place one drop on applicator and apply evenly along the skin of the upper eyelid at base of eyelashes once daily at bedtime; repeat procedure for second eye (use a clean applicator). 3 mL 12   Cholecalciferol 50 MCG (2000 UT) TABS 1 tab by mouth once daily 30 tablet 99   olmesartan (BENICAR) 20 MG tablet TAKE ONE TABLET BY MOUTH EVERY MORNING AND TAKE ONE TABLET BY MOUTH EVERY NIGHT AT BEDTIME 180 tablet 3   ondansetron (ZOFRAN) 4 MG tablet Take 1 tablet (4 mg total) by mouth every 8 (eight) hours as needed for nausea or vomiting. 20 tablet 0   potassium chloride (KLOR-CON) 10 MEQ tablet Take 1 tablet (10 mEq total) by mouth daily for 3 days. 3 tablet 0   tirzepatide (MOUNJARO) 7.5 MG/0.5ML Pen Inject 7.5 mg into the skin once a week. 6 mL 3   tretinoin microspheres (RETIN-A MICRO) 0.04 % gel Retin-A Micro Pump 0.04 % topical gel 45 g 1   No current facility-administered medications on file prior to visit.    Review of Systems     Objective:  There were no vitals filed for this visit. BP Readings from Last 3 Encounters:  07/07/21 120/70  09/24/20 138/84  03/21/20 117/83   Wt Readings from Last 3 Encounters:  07/07/21 240 lb (108.9 kg)  09/24/20 215 lb 12.8 oz (97.9 kg)  08/21/19 221 lb 12.8 oz (100.6 kg)    There is no height or weight on file to calculate BMI.    Physical Exam         Assessment & Plan:    See Problem List for Assessment and Plan of chronic medical problems.

## 2022-05-22 ENCOUNTER — Other Ambulatory Visit: Payer: Self-pay

## 2022-05-22 ENCOUNTER — Ambulatory Visit: Payer: Managed Care, Other (non HMO) | Admitting: Internal Medicine

## 2022-05-22 VITALS — BP 124/78 | HR 94 | Temp 98.1°F | Ht 65.0 in | Wt 206.0 lb

## 2022-05-22 DIAGNOSIS — I10 Essential (primary) hypertension: Secondary | ICD-10-CM

## 2022-05-22 DIAGNOSIS — D509 Iron deficiency anemia, unspecified: Secondary | ICD-10-CM

## 2022-05-22 DIAGNOSIS — E538 Deficiency of other specified B group vitamins: Secondary | ICD-10-CM

## 2022-05-22 DIAGNOSIS — M2550 Pain in unspecified joint: Secondary | ICD-10-CM | POA: Diagnosis not present

## 2022-05-22 DIAGNOSIS — E559 Vitamin D deficiency, unspecified: Secondary | ICD-10-CM

## 2022-05-22 LAB — IBC PANEL
Iron: 66 ug/dL (ref 42–145)
Saturation Ratios: 14.2 % — ABNORMAL LOW (ref 20.0–50.0)
TIBC: 463.4 ug/dL — ABNORMAL HIGH (ref 250.0–450.0)
Transferrin: 331 mg/dL (ref 212.0–360.0)

## 2022-05-22 LAB — VITAMIN B12: Vitamin B-12: 597 pg/mL (ref 211–911)

## 2022-05-22 LAB — C-REACTIVE PROTEIN: CRP: 1.3 mg/dL (ref 0.5–20.0)

## 2022-05-22 LAB — SEDIMENTATION RATE: Sed Rate: 33 mm/hr — ABNORMAL HIGH (ref 0–20)

## 2022-05-22 LAB — FERRITIN: Ferritin: 15.1 ng/mL (ref 10.0–291.0)

## 2022-05-22 LAB — VITAMIN D 25 HYDROXY (VIT D DEFICIENCY, FRACTURES): VITD: 53.08 ng/mL (ref 30.00–100.00)

## 2022-05-22 MED ORDER — TIRZEPATIDE 10 MG/0.5ML ~~LOC~~ SOAJ: 10.0000 mg | SUBCUTANEOUS | 0 refills | Status: DC

## 2022-05-22 NOTE — Patient Instructions (Addendum)
      Blood work was ordered.   The lab is on the first floor.    Medications changes include :   none     Return if symptoms worsen or fail to improve.  

## 2022-05-24 LAB — ANA: Anti Nuclear Antibody (ANA): NEGATIVE

## 2022-05-24 LAB — CYCLIC CITRUL PEPTIDE ANTIBODY, IGG: Cyclic Citrullin Peptide Ab: <16 UNITS

## 2022-05-24 LAB — RHEUMATOID FACTOR: Rheumatoid fact SerPl-aCnc: <14 IU/mL (ref <14)

## 2022-07-04 ENCOUNTER — Other Ambulatory Visit: Payer: Self-pay

## 2022-07-04 ENCOUNTER — Other Ambulatory Visit: Payer: Self-pay | Admitting: Internal Medicine

## 2022-07-04 MED ORDER — OLMESARTAN MEDOXOMIL 20 MG PO TABS: ORAL_TABLET | ORAL | 3 refills | Status: DC

## 2022-07-04 NOTE — Telephone Encounter (Signed)
Please refill as per office routine med refill policy (all routine meds to be refilled for 3 mo or monthly (per pt preference) up to one year from last visit, then month to month grace period for 3 mo, then further med refills will have to be denied)

## 2022-07-05 ENCOUNTER — Telehealth: Payer: Self-pay | Admitting: Internal Medicine

## 2022-07-05 ENCOUNTER — Other Ambulatory Visit: Payer: Self-pay

## 2022-07-05 ENCOUNTER — Other Ambulatory Visit: Payer: Self-pay | Admitting: Internal Medicine

## 2022-07-05 MED ORDER — AMLODIPINE BESYLATE 5 MG PO TABS: 5.0000 mg | ORAL_TABLET | Freq: Every day | ORAL | 0 refills | Status: DC

## 2022-07-05 NOTE — Telephone Encounter (Signed)
Please reconsider this refusal, and refill the medication for one month.  As I mentioned in the instructions, we normally extend the medication month to month for another 3 mo after the first year, so that the patient is being considered first and does not run out of medication, and we dont just go by cookbook rules.

## 2022-07-05 NOTE — Telephone Encounter (Signed)
Patient called stated she is out of her amlodipine 5mg , and needs a refill sent in, looks like it was attempted to be sent in and it was denied.

## 2022-07-05 NOTE — Telephone Encounter (Signed)
Rx request sent to pharmacy, pt saw [place] in December of 2023

## 2022-07-05 NOTE — Telephone Encounter (Signed)
30 day supply has been sent in 

## 2022-07-21 ENCOUNTER — Encounter: Payer: Managed Care, Other (non HMO) | Admitting: Internal Medicine

## 2022-07-21 ENCOUNTER — Telehealth: Payer: Self-pay | Admitting: Internal Medicine

## 2022-07-21 NOTE — Telephone Encounter (Signed)
Called pt and made aware they need to be seen either in person or virtual before medication can be send in. Pt stated if symptoms get worse she will go to urgent care

## 2022-07-21 NOTE — Telephone Encounter (Signed)
Patient called tested positive for COVID last night, canceled her appointment for today but would like something called in for her. Callback number is [phone].

## 2022-07-25 ENCOUNTER — Telehealth: Payer: Self-pay

## 2022-07-25 NOTE — Telephone Encounter (Signed)
Pharmacy Patient Advocate Encounter   Received notification from [facility] that prior authorization for Mounjaro 7.5MG /0.5ML is required/requested.     PA submitted on 07/25/22 to ([place]) [facility] via CoverMyMeds Key [id number]  Status is pending

## 2022-07-26 NOTE — Telephone Encounter (Signed)
Pharmacy Patient Advocate Encounter  Prior Authorization for Mounjaro 7.5mg /0.21ml has been approved.    Key: [id number] Effective dates: 07/25/22 through 02.06.25

## 2022-08-08 ENCOUNTER — Telehealth: Payer: Self-pay | Admitting: Orthopedic Surgery

## 2022-08-08 NOTE — Telephone Encounter (Signed)
Received call from patient requesting copy of records. She has signed release yesterday. Advised pt I will process today.

## 2022-08-11 ENCOUNTER — Other Ambulatory Visit: Payer: Self-pay | Admitting: Internal Medicine

## 2022-08-11 ENCOUNTER — Other Ambulatory Visit: Payer: Self-pay

## 2022-09-12 ENCOUNTER — Other Ambulatory Visit: Payer: Self-pay | Admitting: Internal Medicine

## 2022-09-29 ENCOUNTER — Other Ambulatory Visit: Payer: Self-pay | Admitting: Internal Medicine

## 2022-11-04 ENCOUNTER — Other Ambulatory Visit: Payer: Self-pay | Admitting: Internal Medicine

## 2022-11-07 ENCOUNTER — Telehealth: Payer: Self-pay | Admitting: Radiology

## 2022-11-07 ENCOUNTER — Other Ambulatory Visit: Payer: Self-pay | Admitting: Internal Medicine

## 2022-11-07 NOTE — Telephone Encounter (Signed)
Spoke with pt to make appt to get med refilled, pt states she will call back she was busy at the

## 2022-12-26 ENCOUNTER — Other Ambulatory Visit: Payer: Self-pay | Admitting: Internal Medicine

## 2023-02-14 ENCOUNTER — Telehealth: Payer: Self-pay | Admitting: Internal Medicine

## 2023-02-14 MED ORDER — TIRZEPATIDE 5 MG/0.5ML ~~LOC~~ SOAJ: 5.0000 mg | SUBCUTANEOUS | 3 refills | Status: DC

## 2023-02-14 NOTE — Telephone Encounter (Signed)
Ok to reduce to 5 mg - done erx

## 2023-02-14 NOTE — Telephone Encounter (Signed)
Patient called and said her recent increase of Mounjaro to 10 mg has been causing constipation. She would like to know if it can be decreased back to 5 mg. Best callback is [phone].

## 2023-02-22 ENCOUNTER — Telehealth: Payer: Self-pay

## 2023-02-22 ENCOUNTER — Other Ambulatory Visit (HOSPITAL_COMMUNITY): Payer: Self-pay

## 2023-02-22 NOTE — Telephone Encounter (Signed)
Pharmacy Patient Advocate Encounter   Received notification from CoverMyMeds that prior authorization for [facility] 5MG /0.5ML pen-injectors is required/requested.   Insurance verification completed.   The patient is insured through [facility] .   Per test claim: PA required; PA submitted to [facility] via CoverMyMeds Key/confirmation #/EOC [id number] Status is pending

## 2023-02-23 ENCOUNTER — Other Ambulatory Visit (HOSPITAL_COMMUNITY): Payer: Self-pay

## 2023-02-23 NOTE — Telephone Encounter (Signed)
Pharmacy Patient Advocate Encounter  Received notification from [facility] that Prior Authorization for Mounjaro 5MG /0.5ML pen-injectors  has been CANCELLED due to duplicate request. This medication or product was previously approved on [id number] From Date 2022-07-25 Through Date 2023-07-26 You will be able to fill a prescription for this medication at your pharmacy. If your pharmacy has questions regarding the processing of your prescription, please have them call the [facility] pharmacy help desk at [phone].   PA #/Case ID/Reference #:  [id number]  Per test claim, prescription was filled on 02/14/23. Refill payable on or after 03/08/23. Must first use 80% of previous GLP1 fill, 1 time override allowed per 12 months (pharmacy will have to call pharmacy help desk at [phone] for override)

## 2023-03-19 ENCOUNTER — Ambulatory Visit: Payer: Managed Care, Other (non HMO) | Admitting: Internal Medicine

## 2023-05-11 ENCOUNTER — Other Ambulatory Visit: Payer: Self-pay | Admitting: Internal Medicine

## 2023-05-15 ENCOUNTER — Other Ambulatory Visit: Payer: Self-pay | Admitting: Internal Medicine

## 2023-05-23 ENCOUNTER — Ambulatory Visit: Payer: Managed Care, Other (non HMO) | Admitting: Internal Medicine

## 2023-08-06 ENCOUNTER — Telehealth: Payer: Self-pay

## 2023-08-06 ENCOUNTER — Other Ambulatory Visit (HOSPITAL_COMMUNITY): Payer: Self-pay

## 2023-08-06 NOTE — Telephone Encounter (Signed)
Pharmacy Patient Advocate Encounter   Received notification from Fax that prior authorization for [facility] 5MG /0.5ML auto-injectors is required/requested.   Insurance verification completed.   The patient is insured through [facility] .   Per test claim: PA required; PA started via CoverMyMeds. KEY [id number] . Please see clinical question(s) below that I am not finding the answer to in her chart and advise.

## 2023-08-06 NOTE — Telephone Encounter (Signed)
Yes, pt does exhibit positive response to tx     thanks

## 2023-08-08 ENCOUNTER — Other Ambulatory Visit (HOSPITAL_COMMUNITY): Payer: Self-pay

## 2023-08-08 MED ORDER — TIRZEPATIDE 5 MG/0.5ML ~~LOC~~ SOAJ: 5.0000 mg | SUBCUTANEOUS | 1 refills | Status: DC

## 2023-08-08 NOTE — Telephone Encounter (Signed)
Ok new rx sent °

## 2023-08-08 NOTE — Addendum Note (Signed)
Addended by: [name] on: 08/08/2023 07:43 PM   Modules accepted: Orders

## 2023-08-08 NOTE — Telephone Encounter (Signed)
 Clinical questions have been answered and PA submitted. PA currently Pending.

## 2023-08-08 NOTE — Telephone Encounter (Signed)
Pharmacy Patient Advocate Encounter  Received notification from [facility] that Prior Authorization for Mounjaro 5MG /0.5ML auto-injectors  has been APPROVED from 08/08/23 to 08/07/24. Ran test claim, Copay is $0. This test claim was processed through [facility]- copay amounts may vary at other pharmacies due to pharmacy/plan contracts, or as the patient moves through the different stages of their insurance plan.   PA #/Case ID/Reference #: [id number]

## 2024-02-05 ENCOUNTER — Ambulatory Visit (INDEPENDENT_AMBULATORY_CARE_PROVIDER_SITE_OTHER): Admitting: Internal Medicine

## 2024-02-05 ENCOUNTER — Ambulatory Visit: Payer: Self-pay | Admitting: Internal Medicine

## 2024-02-05 VITALS — BP 124/86 | HR 85 | Temp 98.6°F | Ht 65.0 in | Wt 220.2 lb

## 2024-02-05 DIAGNOSIS — E66813 Obesity, class 3: Secondary | ICD-10-CM

## 2024-02-05 DIAGNOSIS — D509 Iron deficiency anemia, unspecified: Secondary | ICD-10-CM | POA: Diagnosis not present

## 2024-02-05 DIAGNOSIS — Z6836 Body mass index (BMI) 36.0-36.9, adult: Secondary | ICD-10-CM

## 2024-02-05 DIAGNOSIS — Z0001 Encounter for general adult medical examination with abnormal findings: Secondary | ICD-10-CM | POA: Diagnosis not present

## 2024-02-05 DIAGNOSIS — E538 Deficiency of other specified B group vitamins: Secondary | ICD-10-CM | POA: Diagnosis not present

## 2024-02-05 DIAGNOSIS — E7800 Pure hypercholesterolemia, unspecified: Secondary | ICD-10-CM

## 2024-02-05 DIAGNOSIS — Z1211 Encounter for screening for malignant neoplasm of colon: Secondary | ICD-10-CM

## 2024-02-05 DIAGNOSIS — E559 Vitamin D deficiency, unspecified: Secondary | ICD-10-CM

## 2024-02-05 DIAGNOSIS — R7302 Impaired glucose tolerance (oral): Secondary | ICD-10-CM | POA: Diagnosis not present

## 2024-02-05 DIAGNOSIS — Z0000 Encounter for general adult medical examination without abnormal findings: Secondary | ICD-10-CM

## 2024-02-05 DIAGNOSIS — L659 Nonscarring hair loss, unspecified: Secondary | ICD-10-CM

## 2024-02-05 DIAGNOSIS — I10 Essential (primary) hypertension: Secondary | ICD-10-CM

## 2024-02-05 DIAGNOSIS — H538 Other visual disturbances: Secondary | ICD-10-CM

## 2024-02-05 LAB — LIPID PANEL
Cholesterol: 190 mg/dL (ref 0–200)
HDL: 52.6 mg/dL (ref >39.00)
LDL Cholesterol: 108 mg/dL — ABNORMAL HIGH (ref 0–99)
NonHDL: 137.67
Total CHOL/HDL Ratio: 4
Triglycerides: 150 mg/dL — ABNORMAL HIGH (ref 0.0–149.0)
VLDL: 30 mg/dL (ref 0.0–40.0)

## 2024-02-05 LAB — BASIC METABOLIC PANEL WITH GFR
BUN: 10 mg/dL (ref 6–23)
CO2: 26 meq/L (ref 19–32)
Calcium: 8.7 mg/dL (ref 8.4–10.5)
Chloride: 102 meq/L (ref 96–112)
Creatinine, Ser: 0.57 mg/dL (ref 0.40–1.20)
GFR: 106.57 mL/min (ref >60.00)
Glucose, Bld: 87 mg/dL (ref 70–99)
Potassium: 3.7 meq/L (ref 3.5–5.1)
Sodium: 138 meq/L (ref 135–145)

## 2024-02-05 LAB — URINALYSIS, ROUTINE W REFLEX MICROSCOPIC
Bilirubin Urine: NEGATIVE
Hgb urine dipstick: NEGATIVE
Ketones, ur: NEGATIVE
Leukocytes,Ua: NEGATIVE
Nitrite: NEGATIVE
RBC / HPF: NONE SEEN (ref 0–?)
Specific Gravity, Urine: 1.01 (ref 1.000–1.030)
Total Protein, Urine: NEGATIVE
Urine Glucose: NEGATIVE
Urobilinogen, UA: 0.2 (ref 0.0–1.0)
WBC, UA: NONE SEEN (ref 0–?)
pH: 6.5 (ref 5.0–8.0)

## 2024-02-05 LAB — CBC WITH DIFFERENTIAL/PLATELET
Basophils Absolute: 0 K/uL (ref 0.0–0.1)
Basophils Relative: 0.5 % (ref 0.0–3.0)
Eosinophils Absolute: 0.2 K/uL (ref 0.0–0.7)
Eosinophils Relative: 2.2 % (ref 0.0–5.0)
HCT: 42.4 % (ref 36.0–46.0)
Hemoglobin: 14.2 g/dL (ref 12.0–15.0)
Lymphocytes Relative: 23.7 % (ref 12.0–46.0)
Lymphs Abs: 2.2 K/uL (ref 0.7–4.0)
MCHC: 33.5 g/dL (ref 30.0–36.0)
MCV: 89.5 fl (ref 78.0–100.0)
Monocytes Absolute: 0.6 K/uL (ref 0.1–1.0)
Monocytes Relative: 6.6 % (ref 3.0–12.0)
Neutro Abs: 6.2 K/uL (ref 1.4–7.7)
Neutrophils Relative %: 67 % (ref 43.0–77.0)
Platelets: 287 K/uL (ref 150.0–400.0)
RBC: 4.74 Mil/uL (ref 3.87–5.11)
RDW: 13.7 % (ref 11.5–15.5)
WBC: 9.3 K/uL (ref 4.0–10.5)

## 2024-02-05 LAB — HEPATIC FUNCTION PANEL
ALT: 12 U/L (ref 0–35)
AST: 14 U/L (ref 0–37)
Albumin: 4.2 g/dL (ref 3.5–5.2)
Alkaline Phosphatase: 75 U/L (ref 39–117)
Bilirubin, Direct: 0.1 mg/dL (ref 0.0–0.3)
Total Bilirubin: 0.4 mg/dL (ref 0.2–1.2)
Total Protein: 6.9 g/dL (ref 6.0–8.3)

## 2024-02-05 LAB — FERRITIN: Ferritin: 14.3 ng/mL (ref 10.0–291.0)

## 2024-02-05 LAB — IBC PANEL
Iron: 68 ug/dL (ref 42–145)
Saturation Ratios: 16 % — ABNORMAL LOW (ref 20.0–50.0)
TIBC: 424.2 ug/dL (ref 250.0–450.0)
Transferrin: 303 mg/dL (ref 212.0–360.0)

## 2024-02-05 LAB — VITAMIN B12: Vitamin B-12: 290 pg/mL (ref 211–911)

## 2024-02-05 LAB — TSH: TSH: 1.22 u[IU]/mL (ref 0.35–5.50)

## 2024-02-05 LAB — HEMOGLOBIN A1C: Hgb A1c MFr Bld: 5.6 % (ref 4.6–6.5)

## 2024-02-05 LAB — VITAMIN D 25 HYDROXY (VIT D DEFICIENCY, FRACTURES): VITD: 41.05 ng/mL (ref 30.00–100.00)

## 2024-02-05 MED ORDER — TIRZEPATIDE 7.5 MG/0.5ML ~~LOC~~ SOAJ: 7.5000 mg | SUBCUTANEOUS | 3 refills | Status: AC

## 2024-02-05 MED ORDER — MINOXIDIL 2.5 MG PO TABS: 2.5000 mg | ORAL_TABLET | Freq: Every day | ORAL | 3 refills | Status: AC

## 2024-02-05 MED ORDER — AMLODIPINE BESYLATE 5 MG PO TABS
5.0000 mg | ORAL_TABLET | Freq: Every day | ORAL | 3 refills | Status: AC
Start: 2024-02-05 — End: ?

## 2024-02-05 MED ORDER — OLMESARTAN MEDOXOMIL 20 MG PO TABS: ORAL_TABLET | ORAL | 3 refills | Status: AC

## 2024-02-05 NOTE — Patient Instructions (Signed)
 Please take all new medication as prescribed  - the minoxidil  2.5 mg daily and monitor the BP as you mentioned  Please continue all other medications as before, and refills have been done if requested.  Please have the pharmacy call with any other refills you may need.  Please continue your efforts at being more active, low cholesterol diet, and weight control.  You are otherwise up to date with prevention measures today.  Please keep your appointments with your specialists as you may have planned - your yearly GYN exam  You will be contacted regarding the referral for: Colonoscopy, and Ophthalmology  Please go to the LAB at the blood drawing area for the tests to be done  You will be contacted by phone if any changes need to be made immediately.  Otherwise, you will receive a letter about your results with an explanation, but please check with MyChart first.  Please make an Appointment to return for your 1 year visit, or sooner if needed

## 2024-02-05 NOTE — Progress Notes (Signed)
 Patient ID: Ashley Wyatt, female   DOB: 02/05/1976, 48 y.o.   MRN: [id number]         Chief Complaint:: wellness exam and iron  deficiency, low b12, htn, hld, hyperglycemia, obesity, low vit d       HPI:  Ashley Wyatt is a 48 y.o. female here for wellness exam; declines all immunizations, due for colonoscopy, to see GYN soon as scheduled, due for eye exam, o/w up to date                        Also not taking mounjaro  currently as could not tolerate 10 mg due to constipation;  does not want other for now.  Has had mild hair thinning recently, asking for loniten .  Pt denies chest pain, increased sob or doe, wheezing, orthopnea, PND, increased LE swelling, palpitations, dizziness or syncope. Pt denies polydipsia, polyuria, or new focal neuro s/s.   Has had recent left knee pain, and scheduled for PT soon.   Wt Readings from Last 3 Encounters:  02/05/24 220 lb 3.2 oz (99.9 kg)  05/22/22 206 lb (93.4 kg)  07/07/21 240 lb (108.9 kg)   BP Readings from Last 3 Encounters:  02/05/24 124/86  05/22/22 124/78  07/07/21 120/70   Immunization History  Administered Date(s) Administered   Hep A / Hep B 11/01/2009   Hepatitis A, Adult 02/15/2018   Hepb-cpg 02/15/2018, 03/20/2018   Influenza Split 03/10/2011   Influenza Whole 04/10/2008, 03/12/2010   Influenza-Unspecified 03/19/2017, 02/21/2018, 05/10/2021   Moderna Sars-Covid-2 Vaccination 06/10/2020   PFIZER(Purple Top)SARS-COV-2 Vaccination 08/15/2019, 09/03/2019   Pneumococcal Polysaccharide-23 12/17/2018   Td 11/01/2009   Health Maintenance Due  Topic Date Due   OPHTHALMOLOGY EXAM  Never done   Diabetic kidney evaluation - Urine ACR  Never done   Cervical Cancer Screening (HPV/Pap Cotest)  08/20/2013   Colonoscopy  Never done   DTaP/Tdap/Td (2 - Tdap) 11/02/2019   Pneumococcal Vaccine (2 of 2 - PCV) 12/17/2019   COVID-19 Vaccine (4 - 2024-25 season) 02/18/2023   INFLUENZA VACCINE  01/18/2024      Past Medical History:  Diagnosis  Date   ALLERGIC RHINITIS 08/20/2008   Qualifier: Diagnosis of  By: Kassie MD, Sean A    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   Qualifier: Diagnosis of  By: Norleen MD, Ashley ORN    DIABETES MELLITUS, GESTATIONAL, HX OF 11/01/2009   Qualifier: Diagnosis of  By: Norleen MD, Ashley ORN    ECZEMA 08/20/2008   Qualifier: Diagnosis of  By: Kassie MD, Sean A    HYPERCHOLESTEROLEMIA 08/20/2008   Qualifier: Diagnosis of  By: Kassie MD, Sean A    HYPERTENSION 02/11/2007   Qualifier: Diagnosis of  By: Kassie MD, Sean A    Hypertriglyceridemia 07/11/2013   Impaired glucose tolerance 07/11/2013   STRABISMUS 06/30/2010   Qualifier: Diagnosis of  By: Norleen MD, Ashley ORN    VITAMIN D  DEFICIENCY 11/01/2009   Qualifier: Diagnosis of  By: Norleen MD, Ashley ORN    Past Surgical History:  Procedure Laterality Date   right knee surgury     arthroscopy   TONSILLECTOMY      reports that she has never smoked. She has never used smokeless tobacco. She reports that she does not drink alcohol and does not use drugs. family history includes Breast cancer in an other family member; Hypertension in an other family member. No Known Allergies Current Outpatient Medications on File Prior to Visit  Medication Sig Dispense Refill  Cholecalciferol  50 MCG (2000 UT) TABS 1 tab by mouth once daily 30 tablet 99   tretinoin  microspheres (RETIN-A  MICRO) 0.04 % gel Retin-A  Micro Pump 0.04 % topical gel 45 g 1   bimatoprost  (LATISSE ) 0.03 % ophthalmic solution PLACE ONE DROP ON APPLICATOR AND APPLY EVENLY ALONG THE SKIN OF THE UPPER EYELID AT BASE OF EYELASHES ONCE DAILY AT BEDTIME; REPEAT PROCEDURE FOR SECOND EYE (USE CLEAN APPLICATOR) (Patient not taking: Reported on 02/05/2024) 6 mL 0   ondansetron  (ZOFRAN ) 4 MG tablet Take 1 tablet (4 mg total) by mouth every 8 (eight) hours as needed for nausea or vomiting. (Patient not taking: Reported on 02/05/2024) 20 tablet 0   No current facility-administered medications on file prior to visit.        ROS:  All  others reviewed and negative.  Objective        PE:  BP 124/86   Pulse 85   Temp 98.6 F (37 C) (Oral)   Ht 5' 5 (1.651 m)   Wt 220 lb 3.2 oz (99.9 kg)   SpO2 97%   BMI 36.64 kg/m                 Constitutional: Pt appears in NAD               HENT: Head: NCAT.                Right Ear: External ear normal.                 Left Ear: External ear normal.                Eyes: . Pupils are equal, round, and reactive to light. Conjunctivae and EOM are normal               Nose: without d/c or deformity               Neck: Neck supple. Gross normal ROM               Cardiovascular: Normal rate and regular rhythm.                 Pulmonary/Chest: Effort normal and breath sounds without rales or wheezing.                Abd:  Soft, NT, ND, + BS, no organomegaly               Neurological: Pt is alert. At baseline orientation, motor grossly intact               Skin: Skin is warm. No rashes, no other new lesions, LE edema - none               Psychiatric: Pt behavior is normal without agitation   Micro: none  Cardiac tracings I have personally interpreted today:  none  Pertinent Radiological findings (summarize): none   Lab Results  Component Value Date   WBC 9.3 02/05/2024   HGB 14.2 02/05/2024   HCT 42.4 02/05/2024   PLT 287.0 02/05/2024   GLUCOSE 87 02/05/2024   CHOL 190 02/05/2024   TRIG 150.0 (H) 02/05/2024   HDL 52.60 02/05/2024   LDLDIRECT 109.0 07/07/2021   LDLCALC 108 (H) 02/05/2024   ALT 12 02/05/2024   AST 14 02/05/2024   NA 138 02/05/2024   K 3.7 02/05/2024   CL 102 02/05/2024   CREATININE 0.57 02/05/2024   BUN 10 02/05/2024  CO2 26 02/05/2024   TSH 1.22 02/05/2024   HGBA1C 5.6 02/05/2024   Assessment/Plan:  Ashley Wyatt is a 48 y.o. Other or two or more races [6] female with  has a past medical history of ALLERGIC RHINITIS (08/20/2008), ANEMIA-IRON  DEFICIENCY (11/01/2009), DIABETES MELLITUS, GESTATIONAL, HX OF (11/01/2009), ECZEMA (08/20/2008),  HYPERCHOLESTEROLEMIA (08/20/2008), HYPERTENSION (02/11/2007), Hypertriglyceridemia (07/11/2013), Impaired glucose tolerance (07/11/2013), STRABISMUS (06/30/2010), and VITAMIN D  DEFICIENCY (11/01/2009).  Preventative health care Age and sex appropriate education and counseling updated with regular exercise and diet Referrals for preventative services - pt has appt to see GYN soon, now for optho exam referral and colonoscopy Immunizations addressed - declines all Smoking counseling  - none needed Evidence for depression or other mood disorder - none significant Most recent labs reviewed. I have personally reviewed and have noted: 1) the patient's medical and social history 2) The patient's current medications and supplements 3) The patient's height, weight, and BMI have been recorded in the chart   ANEMIA-IRON  DEFICIENCY Also for iron  with labs  B12 deficiency Lab Results  Component Value Date   VITAMINB12 290 02/05/2024   Stable, cont oral replacement - b12 1000 mcg qd   Essential hypertension BP Readings from Last 3 Encounters:  02/05/24 124/86  05/22/22 124/78  07/07/21 120/70   Stable, pt to continue medical treatment norvasc  5 every day, benicar  20 bid   HYPERCHOLESTEROLEMIA Lab Results  Component Value Date   LDLCALC 108 (H) 02/05/2024   uncontrolled, pt for lower chol diet, declines statin   Impaired glucose tolerance Lab Results  Component Value Date   HGBA1C 5.6 02/05/2024   Stable, pt to continue current medical treatment  - diet, wt control   Obesity Also for thyroid  panel with labs  Vitamin D  deficiency Last vitamin D  Lab Results  Component Value Date   VD25OH 41.05 02/05/2024   Stable, cont oral replacement   Hair loss Also for loniten  2.5 mg qd  Followup: Return in about 1 year (around 02/04/2025).  Ashley Rush, MD 02/09/2024 5:25 PM [facility]

## 2024-02-06 LAB — THYROID PANEL WITH TSH
Free Thyroxine Index: 2.7 (ref 1.4–3.8)
T3 Uptake: 26 % (ref 22–35)
T4, Total: 10.5 ug/dL (ref 5.1–11.9)
TSH: 1.35 m[IU]/L

## 2024-02-06 NOTE — Progress Notes (Signed)
 The test results show that your current treatment is OK, as the tests are stable.  Please continue the same plan.  There is no other need for change of treatment or further evaluation based on these results, at this time.  thanks

## 2024-02-09 ENCOUNTER — Encounter: Payer: Self-pay | Admitting: Internal Medicine

## 2024-02-09 DIAGNOSIS — L659 Nonscarring hair loss, unspecified: Secondary | ICD-10-CM | POA: Insufficient documentation

## 2024-02-09 NOTE — Assessment & Plan Note (Signed)
 Lab Results  Component Value Date   VITAMINB12 290 02/05/2024   Stable, cont oral replacement - b12 1000 mcg qd

## 2024-02-09 NOTE — Assessment & Plan Note (Signed)
 Age and sex appropriate education and counseling updated with regular exercise and diet Referrals for preventative services - pt has appt to see GYN soon, now for optho exam referral and colonoscopy Immunizations addressed - declines all Smoking counseling  - none needed Evidence for depression or other mood disorder - none significant Most recent labs reviewed. I have personally reviewed and have noted: 1) the patient's medical and social history 2) The patient's current medications and supplements 3) The patient's height, weight, and BMI have been recorded in the chart

## 2024-02-09 NOTE — Assessment & Plan Note (Signed)
 Lab Results  Component Value Date   LDLCALC 108 (H) 02/05/2024   uncontrolled, pt for lower chol diet, declines statin

## 2024-02-09 NOTE — Assessment & Plan Note (Signed)
 Also for loniten  2.5 mg qd

## 2024-02-09 NOTE — Assessment & Plan Note (Signed)
Also for iron with labs 

## 2024-02-09 NOTE — Assessment & Plan Note (Signed)
 Also for thyroid  panel with labs

## 2024-02-09 NOTE — Assessment & Plan Note (Signed)
 Last vitamin D  Lab Results  Component Value Date   VD25OH 41.05 02/05/2024   Stable, cont oral replacement

## 2024-02-09 NOTE — Assessment & Plan Note (Signed)
 BP Readings from Last 3 Encounters:  02/05/24 124/86  05/22/22 124/78  07/07/21 120/70   Stable, pt to continue medical treatment norvasc  5 every day, benicar  20 bid

## 2024-02-09 NOTE — Assessment & Plan Note (Signed)
 Lab Results  Component Value Date   HGBA1C 5.6 02/05/2024   Stable, pt to continue current medical treatment  - diet, wt control

## 2024-06-02 ENCOUNTER — Other Ambulatory Visit (HOSPITAL_COMMUNITY): Payer: Self-pay

## 2024-06-04 ENCOUNTER — Other Ambulatory Visit (HOSPITAL_COMMUNITY): Payer: Self-pay

## 2024-06-04 ENCOUNTER — Telehealth: Payer: Self-pay

## 2024-06-04 NOTE — Telephone Encounter (Signed)
 Pharmacy Patient Advocate Encounter   Received notification from Onbase that prior authorization for Mounjaro  7.5MG /0.5ML auto-injectors  is required/requested.   Insurance verification completed.   The patient is insured through [facility].   Per test claim: Refill too soon. PA is not needed at this time. Medication was filled 05/01/24. Next eligible fill date is 07/07/24.

## 2024-07-14 ENCOUNTER — Other Ambulatory Visit (HOSPITAL_COMMUNITY): Payer: Self-pay

## 2024-07-15 ENCOUNTER — Other Ambulatory Visit (HOSPITAL_COMMUNITY): Payer: Self-pay

## 2024-07-15 ENCOUNTER — Telehealth: Payer: Self-pay | Admitting: Pharmacy Technician

## 2024-07-15 NOTE — Telephone Encounter (Signed)
 Pharmacy Patient Advocate Encounter   Received notification from [facility] KEY that prior authorization for Mounjaro  5mg  is due for renewal.   Insurance verification completed.   The patient is insured through [facility].  Dose has changed, but does not appear to need a PA at this time Last filled 07/11/24 for a 3 month supply. Archived Key [id number]
